# Patient Record
Sex: Female | Born: 1975 | Race: White | Hispanic: No | Marital: Single | Smoking: Never smoker
Health system: Southern US, Community
[De-identification: ages and names within clinical notes are randomized; demographics above are authoritative.]

## PROBLEM LIST (undated history)

## (undated) DIAGNOSIS — R7303 Prediabetes: Secondary | ICD-10-CM

## (undated) DIAGNOSIS — E079 Disorder of thyroid, unspecified: Secondary | ICD-10-CM

## (undated) DIAGNOSIS — E0500 Thyrotoxicosis with diffuse goiter without thyrotoxic crisis or storm: Secondary | ICD-10-CM

## (undated) DIAGNOSIS — E039 Hypothyroidism, unspecified: Secondary | ICD-10-CM

## (undated) DIAGNOSIS — Z9109 Other allergy status, other than to drugs and biological substances: Secondary | ICD-10-CM

## (undated) DIAGNOSIS — J45909 Unspecified asthma, uncomplicated: Secondary | ICD-10-CM

---
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
## 1997-06-20 HISTORY — PX: OTHER SURGICAL HISTORY: SHX169

## 1997-11-17 ENCOUNTER — Ambulatory Visit (HOSPITAL_COMMUNITY): Admission: RE | Admit: 1997-11-17 | Discharge: 1997-11-17 | Payer: Self-pay | Admitting: Unknown Physician Specialty

## 1997-11-19 ENCOUNTER — Emergency Department (HOSPITAL_COMMUNITY): Admission: EM | Admit: 1997-11-19 | Discharge: 1997-11-19 | Payer: Self-pay

## 1997-12-05 ENCOUNTER — Inpatient Hospital Stay (HOSPITAL_COMMUNITY): Admission: EM | Admit: 1997-12-05 | Discharge: 1997-12-16 | Payer: Self-pay | Admitting: Internal Medicine

## 1997-12-16 ENCOUNTER — Inpatient Hospital Stay (HOSPITAL_COMMUNITY)
Admission: RE | Admit: 1997-12-16 | Discharge: 1997-12-23 | Payer: Self-pay | Admitting: Physical Medicine & Rehabilitation

## 1998-04-27 ENCOUNTER — Emergency Department (HOSPITAL_COMMUNITY): Admission: EM | Admit: 1998-04-27 | Discharge: 1998-04-27 | Payer: Self-pay

## 1999-07-16 ENCOUNTER — Encounter: Payer: Self-pay | Admitting: Family Medicine

## 1999-07-16 ENCOUNTER — Ambulatory Visit (HOSPITAL_COMMUNITY): Admission: RE | Admit: 1999-07-16 | Discharge: 1999-07-16 | Payer: Self-pay | Admitting: Family Medicine

## 2003-12-10 ENCOUNTER — Emergency Department (HOSPITAL_COMMUNITY): Admission: EM | Admit: 2003-12-10 | Discharge: 2003-12-10 | Payer: Self-pay | Admitting: Family Medicine

## 2005-02-09 ENCOUNTER — Emergency Department (HOSPITAL_COMMUNITY): Admission: EM | Admit: 2005-02-09 | Discharge: 2005-02-09 | Payer: Self-pay | Admitting: Emergency Medicine

## 2005-11-27 ENCOUNTER — Emergency Department (HOSPITAL_COMMUNITY): Admission: EM | Admit: 2005-11-27 | Discharge: 2005-11-27 | Payer: Self-pay | Admitting: Emergency Medicine

## 2018-09-17 ENCOUNTER — Emergency Department (HOSPITAL_COMMUNITY): Payer: Self-pay

## 2018-09-17 ENCOUNTER — Inpatient Hospital Stay (HOSPITAL_COMMUNITY)
Admission: EM | Admit: 2018-09-17 | Discharge: 2018-09-20 | DRG: 494 | Disposition: A | Discharge status: Home Health | Payer: Self-pay | Attending: Orthopedic Surgery | Admitting: Orthopedic Surgery

## 2018-09-17 ENCOUNTER — Encounter (HOSPITAL_COMMUNITY): Payer: Self-pay | Admitting: *Deleted

## 2018-09-17 DIAGNOSIS — S82262A Displaced segmental fracture of shaft of left tibia, initial encounter for closed fracture: Principal | ICD-10-CM | POA: Diagnosis present

## 2018-09-17 DIAGNOSIS — S60511A Abrasion of right hand, initial encounter: Secondary | ICD-10-CM | POA: Diagnosis present

## 2018-09-17 DIAGNOSIS — Z885 Allergy status to narcotic agent status: Secondary | ICD-10-CM

## 2018-09-17 DIAGNOSIS — J45909 Unspecified asthma, uncomplicated: Secondary | ICD-10-CM | POA: Diagnosis present

## 2018-09-17 DIAGNOSIS — W1789XA Other fall from one level to another, initial encounter: Secondary | ICD-10-CM | POA: Diagnosis present

## 2018-09-17 DIAGNOSIS — Z419 Encounter for procedure for purposes other than remedying health state, unspecified: Secondary | ICD-10-CM

## 2018-09-17 DIAGNOSIS — Z6838 Body mass index (BMI) 38.0-38.9, adult: Secondary | ICD-10-CM

## 2018-09-17 DIAGNOSIS — E669 Obesity, unspecified: Secondary | ICD-10-CM | POA: Diagnosis present

## 2018-09-17 DIAGNOSIS — R7303 Prediabetes: Secondary | ICD-10-CM | POA: Diagnosis present

## 2018-09-17 DIAGNOSIS — Z79890 Hormone replacement therapy (postmenopausal): Secondary | ICD-10-CM

## 2018-09-17 DIAGNOSIS — Z7982 Long term (current) use of aspirin: Secondary | ICD-10-CM

## 2018-09-17 DIAGNOSIS — S82202A Unspecified fracture of shaft of left tibia, initial encounter for closed fracture: Secondary | ICD-10-CM | POA: Diagnosis present

## 2018-09-17 DIAGNOSIS — S82402A Unspecified fracture of shaft of left fibula, initial encounter for closed fracture: Secondary | ICD-10-CM | POA: Diagnosis present

## 2018-09-17 DIAGNOSIS — S82265A Nondisplaced segmental fracture of shaft of left tibia, initial encounter for closed fracture: Secondary | ICD-10-CM

## 2018-09-17 HISTORY — DX: Other allergy status, other than to drugs and biological substances: Z91.09

## 2018-09-17 HISTORY — DX: Unspecified asthma, uncomplicated: J45.909

## 2018-09-17 HISTORY — DX: Prediabetes: R73.03

## 2018-09-17 MED ORDER — ACETAMINOPHEN 325 MG PO TABS
650.0000 mg | ORAL_TABLET | Freq: Four times a day (QID) | ORAL | Status: DC | PRN
  Administered 2018-09-17 – 2018-09-18 (×2): 650 mg via ORAL
  Administered 2018-09-18 (×2): 325 mg via ORAL
  Administered 2018-09-19 – 2018-09-20 (×4): 650 mg via ORAL
  Filled 2018-09-17 (×8): qty 2

## 2018-09-17 MED ORDER — METHOCARBAMOL 500 MG PO TABS
500.0000 mg | ORAL_TABLET | Freq: Four times a day (QID) | ORAL | Status: DC | PRN
  Administered 2018-09-18: 250 mg via ORAL
  Filled 2018-09-17 (×2): qty 1

## 2018-09-17 MED ORDER — ONDANSETRON 4 MG PO TBDP: 4.0000 mg | ORAL_TABLET | Freq: Four times a day (QID) | ORAL | Status: DC | PRN

## 2018-09-17 MED ORDER — ONDANSETRON HCL 4 MG/2ML IJ SOLN: 4.0000 mg | Freq: Four times a day (QID) | INTRAMUSCULAR | Status: DC | PRN

## 2018-09-17 MED ORDER — LEVOTHYROXINE SODIUM 75 MCG PO TABS
150.0000 ug | ORAL_TABLET | Freq: Every day | ORAL | Status: DC
  Administered 2018-09-18 – 2018-09-20 (×3): 150 ug via ORAL
  Filled 2018-09-17: qty 2
  Filled 2018-09-17: qty 1
  Filled 2018-09-17: qty 2

## 2018-09-17 MED ORDER — CHLORHEXIDINE GLUCONATE 4 % EX LIQD: 60.0000 mL | Freq: Once | CUTANEOUS | Status: DC

## 2018-09-17 MED ORDER — DIPHENHYDRAMINE HCL 50 MG/ML IJ SOLN: 25.0000 mg | Freq: Four times a day (QID) | INTRAMUSCULAR | Status: DC | PRN

## 2018-09-17 MED ORDER — CEFAZOLIN SODIUM-DEXTROSE 2-4 GM/100ML-% IV SOLN
2.0000 g | INTRAVENOUS | Status: AC
  Administered 2018-09-18: 2 g via INTRAVENOUS
  Filled 2018-09-17: qty 100

## 2018-09-17 MED ORDER — SODIUM CHLORIDE 0.9 % IV SOLN
INTRAVENOUS | Status: DC
  Administered 2018-09-17: 23:00:00 via INTRAVENOUS

## 2018-09-17 MED ORDER — POVIDONE-IODINE 10 % EX SWAB: 2.0000 "application " | Freq: Once | CUTANEOUS | Status: DC

## 2018-09-17 MED ORDER — FENTANYL CITRATE (PF) 100 MCG/2ML IJ SOLN
12.5000 ug | INTRAMUSCULAR | Status: DC | PRN
  Filled 2018-09-17: qty 2

## 2018-09-17 MED ORDER — DIPHENHYDRAMINE HCL 25 MG PO CAPS: 25.0000 mg | ORAL_CAPSULE | Freq: Four times a day (QID) | ORAL | Status: DC | PRN

## 2018-09-17 NOTE — ED Notes (Signed)
ED TO INPATIENT HANDOFF REPORT  ED Nurse Name and Phone #: Florentina Addison, RN  S Name/Age/Gender Shelby Yang 43 y.o. female Room/Bed: WA16/WA16  Code Status   Code Status: Full Code  Home/SNF/Other Home Patient oriented to: AOx4 Is this baseline? Yes   Triage Complete: Triage complete  Chief Complaint Fall/left leg injury  Triage Note Pt BIBA from home c/o fall.  Pt reported walking into house from porch, wood beam on porch broke, pt fell through (right leg), left leg hit raised beam.    10/10 pain- pt refused pain meds from EMS. Pt repots allergies to hydrocodone.   Denies LOC, did not hit head, denies neck pain.     Allergies Allergies  Allergen Reactions  . Codeine Anaphylaxis and Hives  . Other Anaphylaxis and Shortness Of Breath    NOVACAINE    Level of Care/Admitting Diagnosis ED Disposition    ED Disposition Condition Comment   Admit  Hospital Area: [facility] [[id number]]  Level of Care: Med-Surg [16]  Diagnosis: Closed left tibial fracture [292446]  Admitting Physician: Yolonda Kida [[id number]]  Attending Physician: Yolonda Kida [[id number]]  Estimated length of stay: past midnight tomorrow  Certification:: I certify this patient will need inpatient services for at least 2 midnights  PT Class (Do Not Modify): Inpatient [101]  PT Acc Code (Do Not Modify): Private [1]       B Medical/Surgery History Past Medical History:  Diagnosis Date  . Asthma   . Environmental allergies   . Prediabetes    Past Surgical History:  Procedure Laterality Date  . radiation to thyroid  1999  . TONSILLECTOMY  1993     A IV Location/Drains/Wounds Patient Lines/Drains/Airways Status   Active Line/Drains/Airways    None          Intake/Output Last 24 hours No intake or output data in the 24 hours ending 09/17/18 2057  Labs/Imaging No results found for this or any previous visit (from the past 48 hour(s)). Dg Tibia/fibula  Left  Result Date: 09/17/2018 CLINICAL DATA:  Fall today with left leg pain. EXAM: LEFT TIBIA AND FIBULA - 2 VIEW COMPARISON:  None. FINDINGS: Exam demonstrates a mildly displaced slightly oblique fracture of the proximal tibial diaphysis with second oblique slightly comminuted fracture site over the mid to distal tibial diaphysis. Remainder the exam is unremarkable. IMPRESSION: Minimally displaced fractures over the proximal tibial diaphysis as well as second fracture site with mild comminution over the mid to distal tibial diaphysis. Electronically Signed   By: Elberta Fortis M.D.   On: 09/17/2018 18:01    Pending Labs Unresulted Labs (From admission, onward)    Start     Ordered   09/17/18 2009  Basic metabolic panel  Once,   R     03/83/33 2009   09/17/18 2009  CBC WITH DIFFERENTIAL  Once,   R     09/17/18 2009   09/17/18 2006  HIV antibody (Routine Testing)  Once,   R     09/17/18 2009          Vitals/Pain Today's Vitals   09/17/18 1900 09/17/18 1958 09/17/18 2007 09/17/18 2030  BP: 128/65  (!) 149/76 (!) 161/65  Pulse: 81  73 72  Resp: (!) 26  20 20   Temp:      TempSrc:      SpO2: 100%  100% 100%  PainSc:  5       Isolation Precautions No active isolations  Medications Medications  levothyroxine (SYNTHROID, LEVOTHROID) tablet 150 mcg (has no administration in time range)  0.9 %  sodium chloride infusion (has no administration in time range)  fentaNYL (SUBLIMAZE) injection 12.5 mcg (has no administration in time range)  methocarbamol (ROBAXIN) tablet 500 mg (has no administration in time range)  diphenhydrAMINE (BENADRYL) capsule 25 mg (has no administration in time range)    Or  diphenhydrAMINE (BENADRYL) injection 25 mg (has no administration in time range)  ondansetron (ZOFRAN-ODT) disintegrating tablet 4 mg (has no administration in time range)    Or  ondansetron (ZOFRAN) injection 4 mg (has no administration in time range)    Mobility walks Low fall risk    Focused Assessments    R Recommendations: See Admitting Provider Note  Report given to:   Additional Notes: NA

## 2018-09-17 NOTE — ED Provider Notes (Signed)
[facility]-EMERGENCY DEPT Provider Note   CSN: [id number] Arrival date & time: 09/17/18  1657    History   Chief Complaint Chief Complaint  Patient presents with  . Fall    HPI Shelby Yang is a 43 y.o. female.     HPI Patient presents after a fall.  States she was walking on a [place] porch and broke.  She fell through in the left leg felt backwards.  States it hit a 2 x 4 door.  Pain in the left lower leg.  States she thinks it is broken.  States she was unable to walk with.  Also hit right forearm but states it really does not hurt.  Not on anticoagulation.  Last ate some soup around 2 hours ago.  She is on thyroid medications.  No anticoagulation.  No neck pain.  No other injury.  Skin has had no bleeding.  States she cannot be pregnant.  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  Patient does not want pain medicine.  States she cannot take pills also. Past Medical History:  Diagnosis Date  . Asthma   . Environmental allergies   . Prediabetes     There are no active problems to display for this patient.   Past Surgical History:  Procedure Laterality Date  . radiation to thyroid  1999  . TONSILLECTOMY  1993     OB History   No obstetric history on file.      Home Medications    Prior to Admission medications   Medication Sig Start Date End Date Taking? Authorizing Provider  aspirin 325 MG tablet Take 162.5 mg by mouth daily as needed for moderate pain.   Yes [provider]  levothyroxine (SYNTHROID, LEVOTHROID) 150 MCG tablet Take 150 mcg by mouth daily. 09/13/18  Yes [provider]    Family History History reviewed. No pertinent family history.  Social History Social History   Tobacco Use  . Smoking status: Never Smoker  . Smokeless tobacco: Never Used  Substance Use Topics  . Alcohol use: Never    Frequency: Never  . Drug use: Never     Allergies   Codeine and Other   Review of Systems Review of Systems   Constitutional: Negative for appetite change.  HENT: Negative for congestion.   Respiratory: Negative for shortness of breath.   Cardiovascular: Negative for leg swelling.  Gastrointestinal: Negative for abdominal pain.  Genitourinary: Negative for flank pain.  Musculoskeletal: Negative for neck pain.       Left lower leg pain  Skin: Positive for wound.  Neurological: Negative for numbness.  Hematological: Negative for adenopathy.     Physical Exam Updated Vital Signs BP 128/65   Pulse 81   Temp (!) 97.4 F (36.3 C) (Axillary)   Resp (!) 26   SpO2 100%   Physical Exam Vitals signs and nursing note reviewed.  HENT:     Head: Atraumatic.     Nose: Nose normal.  Eyes:     Extraocular Movements: Extraocular movements intact.  Neck:     Musculoskeletal: Neck supple.  Cardiovascular:     Comments: Tachycardia Abdominal:     Tenderness: There is no abdominal tenderness.  Musculoskeletal:     Comments: Tenderness to left lower leg just proximal to the ankle.  There is an abrasion over the left anterior ankle but no underlying bony tenderness.  Knee with some tenderness but much less than the lower leg.  Neurovascular intact in left foot.  No other extremity tenderness or deformity.  No spinal tenderness.  No bleeding.  Skin:    General: Skin is warm.     Capillary Refill: Capillary refill takes less than 2 seconds.  Neurological:     General: No focal deficit present.     Mental Status: She is alert.      ED Treatments / Results  Labs (all labs ordered are listed, but only abnormal results are displayed) Labs Reviewed - No data to display  EKG None  Radiology Dg Tibia/fibula Left  Result Date: 09/17/2018 CLINICAL DATA:  Fall today with left leg pain. EXAM: LEFT TIBIA AND FIBULA - 2 VIEW COMPARISON:  None. FINDINGS: Exam demonstrates a mildly displaced slightly oblique fracture of the proximal tibial diaphysis with second oblique slightly comminuted fracture site  over the mid to distal tibial diaphysis. Remainder the exam is unremarkable. IMPRESSION: Minimally displaced fractures over the proximal tibial diaphysis as well as second fracture site with mild comminution over the mid to distal tibial diaphysis. Electronically Signed   By: Elberta Fortis M.D.   On: 09/17/2018 18:01    Procedures Procedures (including critical care time)  Medications Ordered in ED Medications - No data to display   Initial Impression / Assessment and Plan / ED Course  I have reviewed the triage vital signs and the nursing notes.  Pertinent labs & imaging results that were available during my care of the patient were reviewed by me and considered in my medical decision making (see chart for details).        Patient with fall.  Segmental fracture of left tibia.  Discussed with Dr. Devonne Doughty.  Patient really does not want to have surgery but they do not think would heal well without it.  Immobilized by Ortho.  Transfer to [facility] for surgery tomorrow.  Final Clinical Impressions(s) / ED Diagnoses   Final diagnoses:  Closed nondisplaced segmental fracture of shaft of left tibia, initial encounter    ED Discharge Orders    None       Benjiman Core, MD 09/17/18 [id number]

## 2018-09-17 NOTE — ED Notes (Signed)
Bed: GY69 Expected date:  Expected time:  Means of arrival:  Comments: EMS-leg pain

## 2018-09-17 NOTE — H&P (Signed)
Shelby Yang is an 43 y.o. female.    Chief Complaint: left leg pain  HPI: 43 y/o female c/o left leg pain after stepping through a rotten floor. Pt c/o immediate,severe pain to left lower leg after the incident. Denies any other injuries other than the left leg. Denies any LOC or syncope. Denies any numbness or tingling to left lower extremity. Pt is very anxious about surgery, admission to the hospital and taking pain medications. Denies any other symptoms  PCP:  Patient, No Pcp Per  PMH: Past Medical History:  Diagnosis Date  . Asthma   . Environmental allergies   . Prediabetes     PSes:  Allergies  Allergen Reactions  . Codeine Anaphylaxis and Hives  . Other Anaphylaxis and Shortness Of Breath    NOVACAINE    Medications: Current Facility-Administered Medications  Medication Dose Route Frequency Provider Last Rate Last Dose  . 0.9 %  sodium chloride infusion   Intravenous Continuous Colvin Caroli, PA-C      . diphenhydrAMINE (BENADRYL) capsule 25 mg  25 mg Oral Q6H PRN Colvin Caroli, PA-C       Or  . diphenhydrAMINE (BENADRYL) injection 25 mg  25 mg Intravenous Q6H PRN Colvin Caroli, PA-C      . fentaNYL (SUBLIMAZE) injection 12.5 mcg  12.5 mcg Intravenous Q2H PRN Colvin Caroli, PA-C      . levothyroxine (SYNTHROID, LEVOTHROID) tablet 150 mcg  150 mcg Oral Daily Graiden Henes, Katharina Caper, PA-C      . methocarbamol (ROBAXIN) tablet 500 mg  500 mg Oral Q6H PRN Colvin Caroli, PA-C      . ondansetron (ZOFRAN-ODT) disintegrating tablet 4 mg  4 mg Oral Q6H PRN Colvin Caroli, PA-C       Or  . ondansetron [facility]) injection 4 mg  4 mg Intravenous Q6H PRN Colvin Caroli, PA-C       Current Outpatient Medications  Medication Sig Dispense Refill  . aspirin 325 MG tablet Take 162.5 mg by mouth daily as needed for moderate pain.    Marland Kitchen levothyroxine (SYNTHROID, LEVOTHROID) 150 MCG tablet Take 150 mcg by mouth daily.       No results found for this or any previous visit (from the past 48 hour(s)). Dg Tibia/fibula Left  Result Date: 09/17/2018 CLINICAL DATA:  Fall today with left leg pain. EXAM: LEFT TIBIA AND FIBULA - 2 VIEW COMPARISON:  None. FINDINGS: Exam demonstrates a mildly displaced slightly oblique fracture of the proximal tibial diaphysis with second oblique slightly comminuted fracture site over the mid to distal tibial diaphysis. Remainder the exam is unremarkable. IMPRESSION: Minimally displaced fractures over the proximal tibial diaphysis as well as second fracture site with mild comminution over the mid to distal tibial diaphysis. Electronically Signed   By: Elberta Fortis M.D.   On: 09/17/2018 18:01    ROS Left leg pain Unable to walk  Physical Exam: Alert and appropriate 43 y/o female in no acute distress Cervical spine with full rom and no tenderness Bilateral upper extremities: full rom with no deformity Small abrasion to right hand otherwise negative exam Hips non tender and pelvis is stable Right lower extremity with full rom and no tenderness nv intact distally Left lower leg: minimal edema and early ecchymosis to distal 1/3 tiba, not taken thru full rom due to known fracture  Assessment/Plan Assessment: left tibia fracture  Plan: Discussed case with Dr. Ranell Patrick and Dr. Aundria Rud and plan for surgical management of  the left tibia fracture to be completed at [facility] hospital tomorrow Discussed risks and benefits of surgery with both the patient and her mother over the telephone Discussed the possibility of fat embolism, blood clot, and compartment syndrome for reasons to stay and have the surgery Pt in agreement  Brad Antonieta Iba, MPAS [facility] is now [facility]  [place] [address], [address], [place], [place] [address] Phone: [phone] [email or web address] Facebook  [employer]

## 2018-09-17 NOTE — ED Triage Notes (Signed)
Pt BIBA from home c/o fall.  Pt reported walking into house from porch, wood beam on porch broke, pt fell through (right leg), left leg hit raised beam.    10/10 pain- pt refused pain meds from EMS. Pt repots allergies to hydrocodone.   Denies LOC, did not hit head, denies neck pain.

## 2018-09-17 NOTE — ED Notes (Signed)
Called [facility] for transfer  

## 2018-09-18 ENCOUNTER — Inpatient Hospital Stay (HOSPITAL_COMMUNITY): Payer: Self-pay

## 2018-09-18 ENCOUNTER — Encounter (HOSPITAL_COMMUNITY): Payer: Self-pay | Admitting: Registered Nurse

## 2018-09-18 ENCOUNTER — Inpatient Hospital Stay (HOSPITAL_COMMUNITY): Payer: Self-pay | Admitting: Registered Nurse

## 2018-09-18 ENCOUNTER — Encounter (HOSPITAL_COMMUNITY)
Admission: EM | Disposition: A | Discharge status: Home Health | Payer: Self-pay | Source: Home / Self Care | Attending: Orthopedic Surgery

## 2018-09-18 ENCOUNTER — Other Ambulatory Visit: Payer: Self-pay

## 2018-09-18 HISTORY — PX: TIBIA IM NAIL INSERTION: SHX2516

## 2018-09-18 LAB — HCG, QUANTITATIVE, PREGNANCY

## 2018-09-18 LAB — SURGICAL PCR SCREEN
MRSA, PCR: NEGATIVE
Staphylococcus aureus: NEGATIVE

## 2018-09-18 SURGERY — INSERTION, INTRAMEDULLARY ROD, TIBIA
Anesthesia: Spinal | Laterality: Left

## 2018-09-18 MED ORDER — PROMETHAZINE HCL 25 MG/ML IJ SOLN: 6.2500 mg | INTRAMUSCULAR | Status: DC | PRN

## 2018-09-18 MED ORDER — BUPIVACAINE HCL (PF) 0.75 % IJ SOLN
INTRAMUSCULAR | Status: DC | PRN
  Administered 2018-09-18: 2 mL via INTRATHECAL

## 2018-09-18 MED ORDER — 0.9 % SODIUM CHLORIDE (POUR BTL) OPTIME
TOPICAL | Status: DC | PRN
  Administered 2018-09-18: 1000 mL

## 2018-09-18 MED ORDER — HYDROMORPHONE HCL 1 MG/ML IJ SOLN: 0.2500 mg | INTRAMUSCULAR | Status: DC | PRN

## 2018-09-18 MED ORDER — MIDAZOLAM HCL 2 MG/2ML IJ SOLN
INTRAMUSCULAR | Status: AC
  Filled 2018-09-18: qty 2

## 2018-09-18 MED ORDER — PROPOFOL 10 MG/ML IV BOLUS
INTRAVENOUS | Status: AC
  Filled 2018-09-18: qty 20

## 2018-09-18 MED ORDER — ONDANSETRON HCL 4 MG/2ML IJ SOLN
INTRAMUSCULAR | Status: DC | PRN
  Administered 2018-09-18: 4 mg via INTRAVENOUS

## 2018-09-18 MED ORDER — ONDANSETRON HCL 4 MG/2ML IJ SOLN: 4.0000 mg | Freq: Four times a day (QID) | INTRAMUSCULAR | Status: DC | PRN

## 2018-09-18 MED ORDER — CEFAZOLIN SODIUM-DEXTROSE 2-4 GM/100ML-% IV SOLN
2.0000 g | Freq: Four times a day (QID) | INTRAVENOUS | Status: AC
  Administered 2018-09-18 – 2018-09-19 (×3): 2 g via INTRAVENOUS
  Filled 2018-09-18 (×3): qty 100

## 2018-09-18 MED ORDER — DEXAMETHASONE SODIUM PHOSPHATE 10 MG/ML IJ SOLN
INTRAMUSCULAR | Status: DC | PRN
  Administered 2018-09-18: 10 mg via INTRAVENOUS

## 2018-09-18 MED ORDER — FENTANYL CITRATE (PF) 250 MCG/5ML IJ SOLN
INTRAMUSCULAR | Status: AC
  Filled 2018-09-18: qty 5

## 2018-09-18 MED ORDER — OXYCODONE HCL 5 MG/5ML PO SOLN: 5.0000 mg | Freq: Once | ORAL | Status: DC | PRN

## 2018-09-18 MED ORDER — EPHEDRINE SULFATE-NACL 50-0.9 MG/10ML-% IV SOSY
PREFILLED_SYRINGE | INTRAVENOUS | Status: DC | PRN
  Administered 2018-09-18: 5 mg via INTRAVENOUS
  Administered 2018-09-18: 10 mg via INTRAVENOUS

## 2018-09-18 MED ORDER — ONDANSETRON HCL 4 MG PO TABS: 4.0000 mg | ORAL_TABLET | Freq: Four times a day (QID) | ORAL | Status: DC | PRN

## 2018-09-18 MED ORDER — SODIUM CHLORIDE 0.9 % IV SOLN
INTRAVENOUS | Status: DC | PRN
  Administered 2018-09-18: 30 ug/min via INTRAVENOUS

## 2018-09-18 MED ORDER — PROPOFOL 10 MG/ML IV BOLUS
INTRAVENOUS | Status: DC | PRN
  Administered 2018-09-18 (×2): 50 mg via INTRAVENOUS

## 2018-09-18 MED ORDER — TRAMADOL HCL 50 MG PO TABS
50.0000 mg | ORAL_TABLET | Freq: Four times a day (QID) | ORAL | Status: DC | PRN
  Administered 2018-09-18: 25 mg via ORAL
  Administered 2018-09-19 – 2018-09-20 (×3): 50 mg via ORAL
  Filled 2018-09-18 (×4): qty 1

## 2018-09-18 MED ORDER — MIDAZOLAM HCL 5 MG/5ML IJ SOLN
INTRAMUSCULAR | Status: DC | PRN
  Administered 2018-09-18: 2 mg via INTRAVENOUS

## 2018-09-18 MED ORDER — METOCLOPRAMIDE HCL 5 MG/ML IJ SOLN: 5.0000 mg | Freq: Three times a day (TID) | INTRAMUSCULAR | Status: DC | PRN

## 2018-09-18 MED ORDER — PHENYLEPHRINE 40 MCG/ML (10ML) SYRINGE FOR IV PUSH (FOR BLOOD PRESSURE SUPPORT)
PREFILLED_SYRINGE | INTRAVENOUS | Status: DC | PRN
  Administered 2018-09-18 (×2): 80 ug via INTRAVENOUS

## 2018-09-18 MED ORDER — DOCUSATE SODIUM 100 MG PO CAPS
100.0000 mg | ORAL_CAPSULE | Freq: Two times a day (BID) | ORAL | Status: DC
  Filled 2018-09-18 (×2): qty 1

## 2018-09-18 MED ORDER — LACTATED RINGERS IV SOLN
INTRAVENOUS | Status: DC | PRN
  Administered 2018-09-18 (×2): via INTRAVENOUS

## 2018-09-18 MED ORDER — PROPOFOL 500 MG/50ML IV EMUL
INTRAVENOUS | Status: DC | PRN
  Administered 2018-09-18: 75 ug/kg/min via INTRAVENOUS

## 2018-09-18 MED ORDER — PROPOFOL 1000 MG/100ML IV EMUL
INTRAVENOUS | Status: AC
  Filled 2018-09-18: qty 200

## 2018-09-18 MED ORDER — METOCLOPRAMIDE HCL 5 MG PO TABS: 5.0000 mg | ORAL_TABLET | Freq: Three times a day (TID) | ORAL | Status: DC | PRN

## 2018-09-18 MED ORDER — OXYCODONE HCL 5 MG PO TABS: 5.0000 mg | ORAL_TABLET | Freq: Once | ORAL | Status: DC | PRN

## 2018-09-18 SURGICAL SUPPLY — 73 items
ALCOHOL 70% 16 OZ (MISCELLANEOUS) ×3 IMPLANT
BANDAGE ACE 4X5 VEL STRL LF (GAUZE/BANDAGES/DRESSINGS) ×3 IMPLANT
BANDAGE ACE 6X5 VEL STRL LF (GAUZE/BANDAGES/DRESSINGS) ×3 IMPLANT
BANDAGE ESMARK 6X9 LF (GAUZE/BANDAGES/DRESSINGS) ×1 IMPLANT
BIT DRILL CAL 3.2 LONG (BIT) ×1 IMPLANT
BIT DRILL CAL 3.2MM LONG (BIT) ×1
BIT DRILL SHORT 3.2MM (DRILL) IMPLANT
BNDG CMPR 9X6 STRL LF SNTH (GAUZE/BANDAGES/DRESSINGS) ×1
BNDG ESMARK 6X9 LF (GAUZE/BANDAGES/DRESSINGS) ×3
COVER MAYO STAND STRL (DRAPES) ×3 IMPLANT
COVER SURGICAL LIGHT HANDLE (MISCELLANEOUS) ×3 IMPLANT
COVER WAND RF STERILE (DRAPES) ×3 IMPLANT
CUFF TOURNIQUET SINGLE 34IN LL (TOURNIQUET CUFF) ×1 IMPLANT
DRAPE C-ARM 35X43 STRL (DRAPES) ×2 IMPLANT
DRAPE C-ARM 42X72 X-RAY (DRAPES) ×2 IMPLANT
DRAPE C-ARMOR (DRAPES) ×5 IMPLANT
DRAPE HALF SHEET 40X57 (DRAPES) ×3 IMPLANT
DRAPE IMP U-DRAPE 54X76 (DRAPES) ×3 IMPLANT
DRAPE ORTHO SPLIT 77X108 STRL (DRAPES) ×3
DRAPE POUCH INSTRU U-SHP 10X18 (DRAPES) ×3 IMPLANT
DRAPE SURG ORHT 6 SPLT 77X108 (DRAPES) IMPLANT
DRAPE U-SHAPE 47X51 STRL (DRAPES) ×3 IMPLANT
DRAPE UTILITY XL STRL (DRAPES) ×6 IMPLANT
DRILL SHORT 3.2MM (DRILL) ×6
DRSG XEROFORM 1X8 (GAUZE/BANDAGES/DRESSINGS) ×2 IMPLANT
DURAPREP 26ML APPLICATOR (WOUND CARE) ×5 IMPLANT
ELECT CAUTERY BLADE 6.4 (BLADE) ×3 IMPLANT
ELECT REM PT RETURN 9FT ADLT (ELECTROSURGICAL) ×3
ELECTRODE REM PT RTRN 9FT ADLT (ELECTROSURGICAL) ×1 IMPLANT
FACESHIELD WRAPAROUND (MASK) IMPLANT
FACESHIELD WRAPAROUND OR TEAM (MASK) ×2 IMPLANT
GAUZE SPONGE 4X4 12PLY STRL (GAUZE/BANDAGES/DRESSINGS) ×3 IMPLANT
GAUZE XEROFORM 1X8 LF (GAUZE/BANDAGES/DRESSINGS) ×6 IMPLANT
GAUZE XEROFORM 5X9 LF (GAUZE/BANDAGES/DRESSINGS) ×2 IMPLANT
GLOVE BIO SURGEON STRL SZ7.5 (GLOVE) ×3 IMPLANT
GLOVE BIO SURGEON STRL SZ8 (GLOVE) ×4 IMPLANT
GLOVE BIOGEL PI IND STRL 6.5 (GLOVE) IMPLANT
GLOVE BIOGEL PI IND STRL 8 (GLOVE) ×1 IMPLANT
GLOVE BIOGEL PI IND STRL 8.5 (GLOVE) IMPLANT
GLOVE BIOGEL PI INDICATOR 6.5 (GLOVE) ×2
GLOVE BIOGEL PI INDICATOR 8 (GLOVE) ×2
GLOVE BIOGEL PI INDICATOR 8.5 (GLOVE) ×2
GLOVE ECLIPSE 6.0 STRL STRAW (GLOVE) ×2 IMPLANT
GOWN STRL REUS W/ TWL LRG LVL3 (GOWN DISPOSABLE) ×2 IMPLANT
GOWN STRL REUS W/ TWL XL LVL3 (GOWN DISPOSABLE) ×1 IMPLANT
GOWN STRL REUS W/TWL LRG LVL3 (GOWN DISPOSABLE) ×6
GOWN STRL REUS W/TWL XL LVL3 (GOWN DISPOSABLE) ×3
GUIDEWIRE 3.2X400 (WIRE) ×2 IMPLANT
KIT BASIN OR (CUSTOM PROCEDURE TRAY) ×3 IMPLANT
MANIFOLD NEPTUNE II (INSTRUMENTS) ×3 IMPLANT
NAIL TIB 8X330 (Nail) ×2 IMPLANT
NS IRRIG 1000ML POUR BTL (IV SOLUTION) ×3 IMPLANT
PACK TOTAL KNEE CUSTOM (KITS) ×3 IMPLANT
PAD ABD 8X10 STRL (GAUZE/BANDAGES/DRESSINGS) ×6 IMPLANT
PAD CAST 4YDX4 CTTN HI CHSV (CAST SUPPLIES) ×2 IMPLANT
PADDING CAST COTTON 4X4 STRL (CAST SUPPLIES) ×3
PADDING CAST COTTON 6X4 STRL (CAST SUPPLIES) ×2 IMPLANT
REAMER ROD DEEP FLUTE 2.5X950 (INSTRUMENTS) ×2 IMPLANT
SCREW LOCK 4X26 TI (Screw) ×2 IMPLANT
SCREW LOCK 4X30 TI (Screw) ×2 IMPLANT
SCREW LOCK 4X66 TI (Screw) ×2 IMPLANT
SCREW LOCKING 4.0 34MM (Screw) ×4 IMPLANT
SCREW LOCKING 4X56 (Screw) ×2 IMPLANT
STAPLER SKIN PROX 35W (STAPLE) ×2 IMPLANT
STAPLER SKIN PROX WIDE 3.9 (STAPLE) ×3 IMPLANT
SUT MON AB 2-0 CT1 36 (SUTURE) ×3 IMPLANT
SUT VIC AB 0 CT1 27 (SUTURE) ×3
SUT VIC AB 0 CT1 27XBRD ANBCTR (SUTURE) ×1 IMPLANT
SUT VIC AB 2-0 CT1 27 (SUTURE) ×3
SUT VIC AB 2-0 CT1 TAPERPNT 27 (SUTURE) IMPLANT
TOWEL OR 17X26 10 PK STRL BLUE (TOWEL DISPOSABLE) ×6 IMPLANT
WATER STERILE IRR 1000ML POUR (IV SOLUTION) ×3 IMPLANT
YANKAUER SUCT BULB TIP NO VENT (SUCTIONS) ×2 IMPLANT

## 2018-09-18 NOTE — Brief Op Note (Signed)
09/18/2018  12:29 PM  PATIENT:  Shelby Yang  43 y.o. female  PRE-OPERATIVE DIAGNOSIS:  Left Tibia Fracture  POST-OPERATIVE DIAGNOSIS:  Left Tibia Fracture  PROCEDURE:  Procedure(s): INTRAMEDULLARY (IM) NAIL TIBIAL (Left)  SURGEON:  Surgeon(s) and Role:    * Yolonda Kida, MD - Primary  PHYSICIAN ASSISTANT:   ASSISTANTS: none   ANESTHESIA:   spinal  EBL:  50 mL   BLOOD ADMINISTERED:none  DRAINS: none   LOCAL MEDICATIONS USED:  NONE  SPECIMEN:  No Specimen  DISPOSITION OF SPECIMEN:  N/A  COUNTS:  YES  TOURNIQUET:  * No tourniquets in log *  DICTATION: .Note written in EPIC  PLAN OF CARE: Admit to inpatient   PATIENT DISPOSITION:  PACU - hemodynamically stable.   Delay start of Pharmacological VTE agent (>24hrs) due to surgical blood loss or risk of bleeding: not applicable

## 2018-09-18 NOTE — H&P (Signed)
H&P update  The surgical history has been reviewed and remains accurate without interval change.  The patient was re-examined and patient's physiologic condition has not changed significantly in the last 30 days. The condition still exists that makes this procedure necessary. The treatment plan remains the same, without new options for care.  No new pharmacological allergies or types of therapy has been initiated that would change the plan or the appropriateness of the plan.  The patient and/or family understand the potential benefits and risks.  Berl Bonfanti P. Aundria Rud, MD 09/18/2018 10:21 AM

## 2018-09-18 NOTE — Anesthesia Preprocedure Evaluation (Signed)
Anesthesia Evaluation  Patient identified by MRN, date of birth, ID band Patient awake    Reviewed: Allergy & Precautions, NPO status , Patient's Chart, lab work & pertinent test results  Airway Mallampati: II  TM Distance: >3 FB Neck ROM: Full    Dental no notable dental hx.    Pulmonary asthma ,    Pulmonary exam normal breath sounds clear to auscultation       Cardiovascular negative cardio ROS Normal cardiovascular exam Rhythm:Regular Rate:Normal     Neuro/Psych negative neurological ROS  negative psych ROS   GI/Hepatic negative GI ROS, Neg liver ROS,   Endo/Other  negative endocrine ROS  Renal/GU negative Renal ROS  negative genitourinary   Musculoskeletal negative musculoskeletal ROS (+)   Abdominal (+) + obese,   Peds negative pediatric ROS (+)  Hematology negative hematology ROS (+)   Anesthesia Other Findings   Reproductive/Obstetrics negative OB ROS                             Anesthesia Physical Anesthesia Plan  ASA: II  Anesthesia Plan: Spinal   Post-op Pain Management:    Induction: Intravenous  PONV Risk Score and Plan: 2 and Ondansetron and Midazolam  Airway Management Planned: Simple Face Mask  Additional Equipment:   Intra-op Plan:   Post-operative Plan:   Informed Consent: I have reviewed the patients History and Physical, chart, labs and discussed the procedure including the risks, benefits and alternatives for the proposed anesthesia with the patient or authorized representative who has indicated his/her understanding and acceptance.     Dental advisory given  Plan Discussed with: CRNA  Anesthesia Plan Comments:         Anesthesia Quick Evaluation

## 2018-09-18 NOTE — Progress Notes (Signed)
Orthopedic Tech Progress Note Patient Details:  Shelby Yang 1976-01-27 [id number]  Ortho Devices Type of Ortho Device: CAM walker Splint Material: Plaster Ortho Device/Splint Location: Left Foot Ortho Device/Splint Interventions: Ordered, Application, Adjustment   Post Interventions Patient Tolerated: Well Instructions Provided: Adjustment of device, Care of device   Eldin Bonsell J Kasee Hantz 09/18/2018, 3:34 PM

## 2018-09-18 NOTE — Care Management (Signed)
Financial counselor will see patient concerning medicaid.

## 2018-09-18 NOTE — Transfer of Care (Signed)
Immediate Anesthesia Transfer of Care Note  Patient: Shelby Yang  Procedure(s) Performed: INTRAMEDULLARY (IM) NAIL TIBIAL (Left )  Patient Location: PACU  Anesthesia Type:MAC and Spinal  Level of Consciousness: drowsy, patient cooperative and responds to stimulation  Airway & Oxygen Therapy: Patient Spontanous Breathing and Patient connected to face mask oxygen  Post-op Assessment: Report given to RN and Post -op Vital signs reviewed and stable  Post vital signs: Reviewed and stable  Last Vitals:  Vitals Value Taken Time  BP 118/75 09/18/2018 12:32 PM  Temp 36.4 C 09/18/2018 12:32 PM  Pulse 55 09/18/2018 12:35 PM  Resp 12 09/18/2018 12:35 PM  SpO2 100 % 09/18/2018 12:35 PM  Vitals shown include unvalidated device data.  Last Pain:  Vitals:   09/18/18 0819  TempSrc:   PainSc: 3       Patients Stated Pain Goal: 3 (50/51/83 3582)  Complications: No apparent anesthesia complications

## 2018-09-18 NOTE — Op Note (Signed)
Date of Surgery: 09/18/2018  INDICATIONS: Ms. Bina is a 43 y.o.-year-old female who was involved in a fall from porch and sustained a left tibia fracture. The risks and benefits of the procedure discussed with the patient prior to the procedure and all questions were answered; consent was obtained.  PREOPERATIVE DIAGNOSIS:  left segmental tibia fracture, closed  POSTOPERATIVE DIAGNOSIS: Same  PROCEDURE:   1. Left segmental  tibia closed reduction and intramedullary nailing CPT: 50388    SURGEON: Maryan Rued, M.D.  ASSISTANT: none .  ANESTHESIA:  , spinal  IV FLUIDS AND URINE: See anesthesia record.  ESTIMATED BLOOD LOSS: 50 mL.  IMPLANTS:  Synthes  8 mm x 330 mm 2 proximal and 2 distal interlock   DRAINS: None.  COMPLICATIONS: None.  Tourniquet: None  DESCRIPTION OF PROCEDURE: The patient was brought to the operating room and placed supine on the operating table.  The patient's leg had been signed prior to the Procedure, by myself in the preoperative holding area.  The patient had the anesthesia placed by the anesthesiologist.  The prep verification and incision time-outs were performed to confirm that this was the correct patient, site, side and location. The patient had an SCD on the opposite lower extremity. The patient did receive antibiotics prior to the incision and was re-dosed during the procedure as needed at indicated intervals.   The patient had the lower extremity prepped and draped in the standard surgical fashion.  The incision was first made over the quadriceps tendon in the midline and taken down to the skin and subcutaneous tissue to expose the peritenon. The peritenon was incised in line with the skin incision and then a poke hole was made in the quadriceps tendon in the midline.  A knife was then used to longitudinally divide the tendon in line with its fibers, taking care not to cross over any fibers. Then the guide wire was placed, utilizing  the suprapatellar approach and sleeve instrumentation, at the proximal, anterior tibia, confirming its location on both AP and lateral views. The wire was drilled into the bone and then the opening reamer was placed over this and maneuvered so that the reamer was parallel with anterior cortex of the tibia. The ball-tipped guide wire was then placed down into the canal towards the fracture site. The fracture was reduced, care was taken to confirm reduction on both AP and lateral view, and the wire was passed and confirmed to be in the proper location on both AP and lateral views.  The measuring stick was used to measure the length of the nail.  Sequential reaming was then performed, initial chatter was heard with the 8 mm opening reamer, and we sequentially reamed up to a 9.5 mm reamer to accept a 8 mm nail.  Then the nail was gently hammered into place over the guide wire and the guide wire was removed. The proximal screws were placed through the interlocking drill guide using the sleeve. The distal screws were placed using the perfect circles technique. All screws were placed in the standard fashion, first incising the skin and then spreading with a tonsil, then drilling, measuring with a depth gauge, and then placing the screws by hand.  The final x-rays were taken in both AP and lateral views to confirm the fracture reduction as well as the placement of all hardware.   The fibula fracture was treated in a closed manner.    The wounds were copiously irrigated with saline and then  the peritenon of the quadriceps tendon was closed with 0 Vicryl figure-of-eight interrupted sutures. 2.0 Monocryl was used to close the subcutaneous layer.  Staples were then used to close all of the open incision wounds.  The wounds were cleaned and dried a final time and a sterile dressing was placed.   The patient was then placed in a short leg splint in neutral ankle dorsiflexion. The patient's calf was soft to palpation at the  end of the case.  There was no compartment syndrome.  The patient was then transferred to a bed and taken to the recovery room in stable condition.  All counts were correct at the end of the case.   POSTOPERATIVE PLAN: Ms. Matt will be 50 % WB with walker and will return 2 for suture removal.  Ms. Swearingen will receive DVT prophylaxis in the form of bid asa x 6 weeks.   Duwayne Heck, MD [phone] [facility], [place]

## 2018-09-18 NOTE — Anesthesia Postprocedure Evaluation (Signed)
Anesthesia Post Note  Patient: Shelby Yang  Procedure(s) Performed: INTRAMEDULLARY (IM) NAIL TIBIAL (Left )     Patient location during evaluation: PACU Anesthesia Type: Spinal Level of consciousness: oriented and awake and alert Pain management: pain level controlled Vital Signs Assessment: post-procedure vital signs reviewed and stable Respiratory status: spontaneous breathing, respiratory function stable and patient connected to nasal cannula oxygen Cardiovascular status: blood pressure returned to baseline and stable Postop Assessment: no headache, no backache and no apparent nausea or vomiting Anesthetic complications: no    Last Vitals:  Vitals:   09/18/18 1310 09/18/18 1941  BP: 125/74 140/79  Pulse: 66 69  Resp:  14  Temp:  36.9 C  SpO2: 98% 98%    Last Pain:  Vitals:   09/18/18 2000  TempSrc:   PainSc: 0-No pain                 Emeka Lindner COKER

## 2018-09-18 NOTE — Anesthesia Procedure Notes (Signed)
Spinal  Patient location during procedure: OR Start time: 09/18/2018 10:59 AM End time: 09/18/2018 11:04 AM Staffing Anesthesiologist: Lowella Curb, MD Performed: anesthesiologist  Preanesthetic Checklist Completed: patient identified, site marked, surgical consent, pre-op evaluation, timeout performed, IV checked, risks and benefits discussed and monitors and equipment checked Spinal Block Patient position: sitting Prep: Betadine Patient monitoring: heart rate, cardiac monitor, continuous pulse ox and blood pressure Approach: midline Location: L3-4 Injection technique: single-shot Needle Needle type: Quincke  Needle gauge: 22 G Needle length: 15 cm Assessment Sensory level: T4

## 2018-09-19 LAB — CBC
HCT: 35.6 % — ABNORMAL LOW (ref 36.0–46.0)
Hemoglobin: 12.6 g/dL (ref 12.0–15.0)
MCH: 33 pg (ref 26.0–34.0)
MCHC: 35.4 g/dL (ref 30.0–36.0)
MCV: 93.2 fL (ref 80.0–100.0)
PLATELETS: 284 10*3/uL (ref 150–400)
RBC: 3.82 MIL/uL — ABNORMAL LOW (ref 3.87–5.11)
RDW: 12.8 % (ref 11.5–15.5)
WBC: 10 10*3/uL (ref 4.0–10.5)
nRBC: 0 % (ref 0.0–0.2)

## 2018-09-19 NOTE — Plan of Care (Signed)

## 2018-09-19 NOTE — Plan of Care (Signed)
  Problem: Activity: Goal: Risk for activity intolerance will decrease 09/19/2018 1421 by Darrow Bussing, RN Outcome: Progressing 09/19/2018 1135 by Darrow Bussing, RN Outcome: Progressing   Problem: Nutrition: Goal: Adequate nutrition will be maintained Outcome: Progressing   Problem: Coping: Goal: Level of anxiety will decrease 09/19/2018 1421 by Darrow Bussing, RN Outcome: Progressing 09/19/2018 1135 by Darrow Bussing, RN Outcome: Progressing   Problem: Elimination: Goal: Will not experience complications related to bowel motility Outcome: Progressing   Problem: Pain Managment: Goal: General experience of comfort will improve 09/19/2018 1421 by Darrow Bussing, RN Outcome: Progressing 09/19/2018 1135 by Darrow Bussing, RN Outcome: Progressing   Problem: Safety: Goal: Ability to remain free from injury will improve 09/19/2018 1421 by Darrow Bussing, RN Outcome: Progressing 09/19/2018 1135 by Darrow Bussing, RN Outcome: Progressing   Problem: Skin Integrity: Goal: Risk for impaired skin integrity will decrease 09/19/2018 1421 by Darrow Bussing, RN Outcome: Progressing 09/19/2018 1135 by Darrow Bussing, RN Outcome: Progressing

## 2018-09-19 NOTE — Evaluation (Signed)
Physical Therapy Evaluation Patient Details Name: Shelby Yang MRN: [id number] DOB: Oct 11, 1975 Today's Date: 09/19/2018   History of Present Illness  Patient POD1 s/p Tibial IM nail from fall at home 3/31.  Patient is 50% WB LLE  Clinical Impression  Pt admitted with above diagnosis. Pt currently with functional limitations due to the deficits listed below (see PT Problem List). PTA, pt independent lives with family and adult children with 6 stairs to enter home. Today limited greatly by pain, mod A for bed mobility and only tolerating a transfer to [facility] Discussed pain with RN. Pt has 6 stairs to enter and will need to ambulate in subsequent PT sessions.  Pt will benefit from skilled PT to increase their independence and safety with mobility to allow discharge to the venue listed below.       Follow Up Recommendations Home health PT;Supervision/Assistance - 24 hour(with significant progression )    Equipment Recommendations       Recommendations for Other Services OT consult     Precautions / Restrictions Restrictions Weight Bearing Restrictions: Yes LLE Weight Bearing: Partial weight bearing LLE Partial Weight Bearing Percentage or Pounds: 50      Mobility  Bed Mobility Overal bed mobility: Needs Assistance Bed Mobility: Supine to Sit;Sit to Supine     Supine to sit: Mod assist Sit to supine: Mod assist   General bed mobility comments: Mod A to to assit limb over EOB due t pain  Transfers Overall transfer level: Needs assistance Equipment used: Rolling walker (2 wheeled) Transfers: Sit to/from [employer] Sit to Stand: Mod assist Stand pivot transfers: Mod assist       General transfer comment: mod A to power up very limited by pain today  Ambulation/Gait             General Gait Details: unable due to pain today  Stairs            Wheelchair Mobility    Modified Rankin (Stroke Patients Only)       Balance Overall  balance assessment: Needs assistance   Sitting balance-Leahy Scale: Fair       Standing balance-Leahy Scale: Poor                               Pertinent Vitals/Pain Pain Assessment: Faces Faces Pain Scale: Hurts whole lot Pain Location: L Leg Pain Descriptors / Indicators: Aching;Discomfort;Moaning Pain Intervention(s): Limited activity within patient's tolerance;Monitored during session;Premedicated before session    Home Living Family/patient expects to be discharged to:: Private residence Living Arrangements: Parent Available Help at Discharge: Family Type of Home: House Home Access: Stairs to enter Entrance Stairs-Rails: Can reach both Entrance Stairs-Number of Steps: 6 Home Layout: One level Home Equipment: None Additional Comments: pt with BSC, RW, W/C delivered to room    Prior Function Level of Independence: Independent         Comments: works as Mining engineer        Extremity/Trunk Assessment   Upper Extremity Assessment Upper Extremity Assessment: Overall WFL for tasks assessed    Lower Extremity Assessment Lower Extremity Assessment: Overall WFL for tasks assessed(L post op pain and weakness)       Communication   Communication: No difficulties  Cognition Arousal/Alertness: Awake/alert Behavior During Therapy: WFL for tasks assessed/performed Overall Cognitive Status: Within Functional Limits for tasks assessed  General Comments      Exercises     Assessment/Plan    PT Assessment Patient needs continued PT services  PT Problem List Decreased strength       PT Treatment Interventions DME instruction;Gait training;Stair training;Functional mobility training;Therapeutic activities;Therapeutic exercise    PT Goals (Current goals can be found in the Care Plan section)  Acute Rehab PT Goals Patient Stated Goal: go home PT Goal Formulation: With  patient Time For Goal Achievement: 10/03/18 Potential to Achieve Goals: Good    Frequency Min 3X/week   Barriers to discharge Inaccessible home environment      Co-evaluation               AM-PAC PT "6 Clicks" Mobility  Outcome Measure Help needed turning from your back to your side while in a flat bed without using bedrails?: A Lot Help needed moving from lying on your back to sitting on the side of a flat bed without using bedrails?: A Lot Help needed moving to and from a bed to a chair (including a wheelchair)?: A Lot Help needed standing up from a chair using your arms (e.g., wheelchair or bedside chair)?: A Lot Help needed to walk in hospital room?: Total Help needed climbing 3-5 steps with a railing? : Total 6 Click Score: 10    End of Session Equipment Utilized During Treatment: Gait belt Activity Tolerance: Patient tolerated treatment well Patient left: in bed;with call bell/phone within reach Nurse Communication: Mobility status PT Visit Diagnosis: Unsteadiness on feet (R26.81)    Time: 1500-1530 PT Time Calculation (min) (ACUTE ONLY): 30 min   Charges:   PT Evaluation $PT Eval Low Complexity: 1 Low PT Treatments $Therapeutic Activity: 8-22 mins       Etta Grandchild, PT, DPT Acute Rehabilitation Services Pager: [phone] Office: [phone]   Etta Grandchild 09/19/2018, 4:09 PM

## 2018-09-19 NOTE — Progress Notes (Signed)
   Subjective:  Patient reports pain as mild.  Pain well controlled with tylenol and 1 tramadol.  She is voiding.  Denies, fevers, NS or chills, SOB/CP.  Objective:   VITALS:   Vitals:   09/18/18 1941 09/19/18 0009 09/19/18 0412 09/19/18 1009  BP: 140/79 118/64 112/61 134/73  Pulse: 69 67 67 64  Resp: 14 17 16 16   Temp: 98.5 F (36.9 C) 98.2 F (36.8 C) 98.2 F (36.8 C) 98.1 F (36.7 C)  TempSrc: Oral Oral Oral Oral  SpO2: 98% 96% 97% 99%  Weight:      Height:        Neurovascular intact Sensation intact distally Intact pulses distally Dorsiflexion/Plantar flexion intact Incision: dressing C/D/I Compartment soft   Lab Results  Component Value Date   WBC 10.0 09/19/2018   HGB 12.6 09/19/2018   HCT 35.6 (L) 09/19/2018   MCV 93.2 09/19/2018   PLT 284 09/19/2018   BMET No results found for: NA, K, CL, CO2, GLUCOSE, BUN, CREATININE, CALCIUM, GFRNONAA, GFRAA   Assessment/Plan: 1 Day Post-Op   Active Problems:   Closed left tibial fracture   Advance diet Up with therapy - 50% WB to LLE - maintain dressing x 3 days - pain control - bid asa with SCDs for DVT ppx - will dc tomorrow.   Shelby Yang 09/19/2018, 12:04 PM   Maryan Rued, MD [phone]

## 2018-09-20 ENCOUNTER — Encounter (HOSPITAL_COMMUNITY): Payer: Self-pay | Admitting: Orthopedic Surgery

## 2018-09-20 LAB — CBC
HCT: 35.5 % — ABNORMAL LOW (ref 36.0–46.0)
Hemoglobin: 12.1 g/dL (ref 12.0–15.0)
MCH: 31.9 pg (ref 26.0–34.0)
MCHC: 34.1 g/dL (ref 30.0–36.0)
MCV: 93.7 fL (ref 80.0–100.0)
Platelets: 270 10*3/uL (ref 150–400)
RBC: 3.79 MIL/uL — ABNORMAL LOW (ref 3.87–5.11)
RDW: 13.1 % (ref 11.5–15.5)
WBC: 8.6 10*3/uL (ref 4.0–10.5)
nRBC: 0 % (ref 0.0–0.2)

## 2018-09-20 MED ORDER — OXYCODONE HCL 5 MG PO TABS: 5.0000 mg | ORAL_TABLET | Freq: Four times a day (QID) | ORAL | 0 refills | Status: AC | PRN

## 2018-09-20 MED ORDER — TRAMADOL HCL 50 MG PO TABS: 50.0000 mg | ORAL_TABLET | Freq: Four times a day (QID) | ORAL | 0 refills | Status: DC | PRN

## 2018-09-20 MED ORDER — ONDANSETRON 4 MG PO TBDP: 4.0000 mg | ORAL_TABLET | Freq: Three times a day (TID) | ORAL | 0 refills | Status: DC | PRN

## 2018-09-20 NOTE — Discharge Instructions (Signed)
Orthopedic discharge instructions: - Maintain Postoperative bandages until Saturday morning.  He may remove bandages at that time and begin showering.  Do not submerge her leg under water.  Surgical wounds should be covered with dry dressing following shower.  - Okay for 50% weightbearing to the left leg.  You should elevate your leg with "toes above nose."  - Apply ice to the leg for 20-30 minutes at a time for each hour that you are awake.  - For the Prevention of blood clots take an 81 mg aspirin 2 times per day for 6 weeks.  - Return to see Dr. Aundria Rud in the office for staple removal in 2 weeks.

## 2018-09-20 NOTE — Discharge Summary (Signed)
Patient ID: Shelby Yang MRN: [id number] DOB/AGE: April 08, 1976 43 y.o.  Admit date: 09/17/2018 Discharge date:   Primary Diagnosis: Left segmental tibia fracture  Admission Diagnoses:  Past Medical History:  Diagnosis Date  . Asthma   . Environmental allergies   . Prediabetes    Discharge Diagnoses:   Active Problems:   Closed left tibial fracture  Estimated body mass index is 38.21 kg/m as calculated from the following:   Height as of this encounter: 5\' 8"  (1.727 m).   Weight as of this encounter: 114 kg.  Procedure:  Procedure(s) (LRB): INTRAMEDULLARY (IM) NAIL TIBIAL (Left)   Consults: None  HPI: Presented to ED following a fall through a porch with above injury.  Indicated for urgent surgical management and transferred to [facility] hospital for that care. Laboratory Data: Admission on 09/17/2018  Component Date Value Ref Range Status  . MRSA, PCR 09/17/2018 NEGATIVE  NEGATIVE Final  . Staphylococcus aureus 09/17/2018 NEGATIVE  NEGATIVE Final   Comment: (NOTE) The Xpert SA Assay (FDA approved for NASAL specimens in patients 53 years of age and older), is one component of a comprehensive surveillance program. It is not intended to diagnose infection nor to guide or monitor treatment. Performed at [facility], [address], [place], [place] [address]   . hCG, Beta Chain, Quant, S 09/18/2018 <1  <5 mIU/mL Final   Comment:          GEST. AGE      CONC.  (mIU/mL)   <=1 WEEK        5 - 50     2 WEEKS       50 - 500     3 WEEKS       100 - 10,000     4 WEEKS     1,000 - 30,000     5 WEEKS     3,500 - 115,000   6-8 WEEKS     12,000 - 270,000    12 WEEKS     15,000 - 220,000        FEMALE AND NON-PREGNANT FEMALE:     LESS THAN 5 mIU/mL Performed at [facility], [address], [place], [place] [address]   . WBC 09/19/2018 10.0  4.0 - 10.5 K/uL Final  . RBC 09/19/2018 3.82* 3.87 - 5.11 MIL/uL Final  . Hemoglobin 09/19/2018 12.6  12.0 - 15.0 g/dL  Final  . HCT 47/02/6282 35.6* 36.0 - 46.0 % Final  . MCV 09/19/2018 93.2  80.0 - 100.0 fL Final  . MCH 09/19/2018 33.0  26.0 - 34.0 pg Final  . MCHC 09/19/2018 35.4  30.0 - 36.0 g/dL Final  . RDW 66/29/4765 12.8  11.5 - 15.5 % Final  . Platelets 09/19/2018 284  150 - 400 K/uL Final  . nRBC 09/19/2018 0.0  0.0 - 0.2 % Final   Performed at [facility], [address] [place], [place] [address]  . WBC 09/20/2018 8.6  4.0 - 10.5 K/uL Final  . RBC 09/20/2018 3.79* 3.87 - 5.11 MIL/uL Final  . Hemoglobin 09/20/2018 12.1  12.0 - 15.0 g/dL Final  . HCT 54/65/6812 35.5* 36.0 - 46.0 % Final  . MCV 09/20/2018 93.7  80.0 - 100.0 fL Final  . MCH 09/20/2018 31.9  26.0 - 34.0 pg Final  . MCHC 09/20/2018 34.1  30.0 - 36.0 g/dL Final  . RDW 75/17/0017 13.1  11.5 - 15.5 % Final  . Platelets 09/20/2018 270  150 -  400 K/uL Final  . nRBC 09/20/2018 0.0  0.0 - 0.2 % Final   Performed at [facility], [address] [place], [place] [address]     X-Rays:Dg Tibia/fibula Left  Result Date: 09/18/2018 CLINICAL DATA:  Internal fixation EXAM: DG C-ARM 61-120 MIN; LEFT TIBIA AND FIBULA - 2 VIEW COMPARISON:  09/17/2018 FINDINGS: Placement of intramedullary nail across the left tibial fractures. Near anatomic alignment. No hardware or bony complicating feature. IMPRESSION: Internal fixation across the left tibial fractures. No complicating feature. Electronically Signed   By: Charlett Nose M.D.   On: 09/18/2018 12:40   Dg Tibia/fibula Left  Result Date: 09/17/2018 CLINICAL DATA:  Fall today with left leg pain. EXAM: LEFT TIBIA AND FIBULA - 2 VIEW COMPARISON:  None. FINDINGS: Exam demonstrates a mildly displaced slightly oblique fracture of the proximal tibial diaphysis with second oblique slightly comminuted fracture site over the mid to distal tibial diaphysis. Remainder the exam is unremarkable. IMPRESSION: Minimally displaced fractures over the proximal tibial diaphysis as well as second fracture site  with mild comminution over the mid to distal tibial diaphysis. Electronically Signed   By: Elberta Fortis M.D.   On: 09/17/2018 18:01   Dg C-arm 1-60 Min  Result Date: 09/18/2018 CLINICAL DATA:  Internal fixation EXAM: DG C-ARM 61-120 MIN; LEFT TIBIA AND FIBULA - 2 VIEW COMPARISON:  09/17/2018 FINDINGS: Placement of intramedullary nail across the left tibial fractures. Near anatomic alignment. No hardware or bony complicating feature. IMPRESSION: Internal fixation across the left tibial fractures. No complicating feature. Electronically Signed   By: Charlett Nose M.D.   On: 09/18/2018 12:40    EKG:No orders found for this or any previous visit.   Hospital Course: Shelby Yang is a 43 y.o. who was admitted to Hospital. They were brought to the operating room on 09/18/2018 and underwent Procedure(s): INTRAMEDULLARY (IM) NAIL TIBIAL.  Patient tolerated the procedure well and was later transferred to the recovery room and then to the orthopaedic floor for postoperative care.  They were given PO and IV analgesics for pain control following their surgery.  They were given 24 hours of postoperative antibiotics of  Anti-infectives (From admission, onward)   Start     Dose/Rate Route Frequency Ordered Stop   09/18/18 1700  ceFAZolin (ANCEF) IVPB 2g/100 mL premix     2 g 200 mL/hr over 30 Minutes Intravenous Every 6 hours 09/18/18 1306 09/19/18 0708   09/18/18 1000  ceFAZolin (ANCEF) IVPB 2g/100 mL premix     2 g 200 mL/hr over 30 Minutes Intravenous To ShortStay Surgical 09/17/18 2216 09/18/18 1140     and started on DVT prophylaxis in the form of Aspirin.   PT and OT were ordered.  Discharge planning consulted to help with postop disposition and equipment needs.  Patient had a good night on the evening of surgery.  They started to get up OOB with therapy on day one.  By day 2, the patient had progressed with therapy and meeting their goals.  Incision was healing well.  Patient was seen in rounds  and was ready to go home.   Diet: Regular diet Activity: 50 % WB to LLE Follow-up:in 2 weeks Disposition - Home Discharged Condition: good   Discharge Instructions    Call MD / Call 911   Complete by:  As directed    If you experience chest pain or shortness of breath, CALL 911 and be transported to the hospital emergency room.  If you develope  a fever above 101 F, pus (white drainage) or increased drainage or redness at the wound, or calf pain, call your surgeon's office.   Constipation Prevention   Complete by:  As directed    Drink plenty of fluids.  Prune juice may be helpful.  You may use a stool softener, such as Colace (over the counter) 100 mg twice a day.  Use MiraLax (over the counter) for constipation as needed.   Diet - low sodium heart healthy   Complete by:  As directed    Increase activity slowly as tolerated   Complete by:  As directed      Allergies as of 09/20/2018      Reactions   Codeine Anaphylaxis, Hives   Other Anaphylaxis, Shortness Of Breath   NOVACAINE      Medication List    TAKE these medications   aspirin 325 MG tablet Take 162.5 mg by mouth daily as needed for moderate pain.   levothyroxine 150 MCG tablet Commonly known as:  SYNTHROID, LEVOTHROID Take 150 mcg by mouth daily.   ondansetron 4 MG disintegrating tablet Commonly known as:  Zofran ODT Take 1 tablet (4 mg total) by mouth every 8 (eight) hours as needed.   oxyCODONE 5 MG immediate release tablet Commonly known as:  Roxicodone Take 1 tablet (5 mg total) by mouth every 6 (six) hours as needed for severe pain.   traMADol 50 MG tablet Commonly known as:  ULTRAM Take 1-2 tablets (50-100 mg total) by mouth every 6 (six) hours as needed for moderate pain.      Follow-up Information    Yolonda Kida, MD Follow up in 2 week(s).   Specialty:  Orthopedic Surgery Why:  For suture removal Contact information: [address] [place] [address] [phone]         Care, [facility] Follow up.   Specialty:  Home Health Services Why:  A representative from [facility] will contact you to arrange start date and time for your therapy. Contact information: [address] [place] [address] [phone]           Signed: Maryan Rued, MD Orthopaedic Surgery 09/20/2018, 5:55 PM

## 2018-09-20 NOTE — Progress Notes (Signed)
   Subjective:  Patient reports pain as mild.  Pain well controlled.  She is voiding.  Denies, fevers, NS or chills, SOB/CP.  Objective:   VITALS:   Vitals:   09/19/18 1009 09/19/18 1424 09/19/18 2011 09/20/18 0356  BP: 134/73 136/74 125/63 116/74  Pulse: 64 66 69 64  Resp: 16 16 17 17   Temp: 98.1 F (36.7 C) 98.6 F (37 C) 98.6 F (37 C) 98.7 F (37.1 C)  TempSrc: Oral Oral Oral Oral  SpO2: 99% 98% 99% 97%  Weight:      Height:        Neurovascular intact Sensation intact distally Intact pulses distally Dorsiflexion/Plantar flexion intact Incision: dressing C/D/I Compartment soft   Lab Results  Component Value Date   WBC 8.6 09/20/2018   HGB 12.1 09/20/2018   HCT 35.5 (L) 09/20/2018   MCV 93.7 09/20/2018   PLT 270 09/20/2018   BMET No results found for: NA, K, CL, CO2, GLUCOSE, BUN, CREATININE, CALCIUM, GFRNONAA, GFRAA   Assessment/Plan: 2 Days Post-Op   Active Problems:   Closed left tibial fracture   Advance diet Up with therapy - 50% WB to LLE - maintain dressing x 3 days - pain control - bid asa with SCDs for DVT ppx - will dc later today - appreciate OT/PT    Yolonda Kida 09/20/2018, 12:16 PM   Maryan Rued, MD [phone]

## 2018-09-20 NOTE — Evaluation (Addendum)
Occupational Therapy Evaluation Patient Details Name: Shelby Yang MRN: [id number] DOB: 19-Mar-1976 Today's Date: 09/20/2018    History of Present Illness Patient POD1 s/p Tibial IM nail from fall at home 3/31.  Patient is 50% WB LLE   Clinical Impression   Pt is typically independent. Presents with generalized weakness, L LE pain and impaired balance. She requires min to mod assist for mobility and set up to max assist for ADL. Pt has several steps at home she needs to manage prior to return. Will follow acutely.    Follow Up Recommendations  Home health OT;Supervision/Assistance - 24 hour    Equipment Recommendations  3 in 1 bedside commode;Wheelchair (measurements OT);Wheelchair cushion (measurements OT)    Recommendations for Other Services       Precautions / Restrictions Precautions Precautions: Fall Required Braces or Orthoses: Other Brace Other Brace: L CAM boot Restrictions Weight Bearing Restrictions: Yes LLE Weight Bearing: Partial weight bearing LLE Partial Weight Bearing Percentage or Pounds: 50      Mobility Bed Mobility Overal bed mobility: Needs Assistance Bed Mobility: Supine to Sit     Supine to sit: Mod assist     General bed mobility comments: assist to raise trunk and manage L UE  Transfers Overall transfer level: Needs assistance Equipment used: Rolling walker (2 wheeled) Transfers: Sit to/from [employer] Sit to Stand: Min assist         General transfer comment: cues for technique, hand placement and assist to rise and steady    Balance Overall balance assessment: Needs assistance   Sitting balance-Leahy Scale: Fair       Standing balance-Leahy Scale: Poor                             ADL either performed or assessed with clinical judgement   ADL Overall ADL's : Needs assistance/impaired Eating/Feeding: Independent;Bed level   Grooming: Wash/dry hands;Sitting;Set up   Upper Body Bathing:  Set up;Sitting   Lower Body Bathing: Maximal assistance;Sit to/from stand   Upper Body Dressing : Set up;Sitting   Lower Body Dressing: Maximal assistance;Sit to/from stand   Toilet Transfer: Minimal assistance;RW;BSC;Ambulation   Toileting- Clothing Manipulation and Hygiene: Minimal assistance;Sit to/from stand               Vision Baseline Vision/History: No visual deficits       Perception     Praxis      Pertinent Vitals/Pain Pain Assessment: 0-10 Pain Score: 5  Pain Location: L Leg Pain Descriptors / Indicators: Aching;Discomfort;Moaning Pain Intervention(s): Monitored during session;Premedicated before session;Repositioned     Hand Dominance Right   Extremity/Trunk Assessment Upper Extremity Assessment Upper Extremity Assessment: Overall WFL for tasks assessed   Lower Extremity Assessment Lower Extremity Assessment: Defer to PT evaluation       Communication Communication Communication: No difficulties   Cognition Arousal/Alertness: Awake/alert Behavior During Therapy: Anxious Overall Cognitive Status: Within Functional Limits for tasks assessed                                     General Comments       Exercises     Shoulder Instructions      Home Living Family/patient expects to be discharged to:: Private residence Living Arrangements: Spouse/significant other;Children;Non-relatives/Friends(fiance, 2 adult children and fiance's dad) Available Help at Discharge: Family;Available 24 hours/day Type of Home: House Home  Access: Stairs to enter [employer] of Steps: 6 Entrance Stairs-Rails: Can reach both Home Layout: One level     Bathroom Shower/Tub: Chief Strategy Officer: Standard     Home Equipment: None   Additional Comments: pt with BSC, RW in room      Prior Functioning/Environment Level of Independence: Independent        Comments: works as Production designer, theatre/television/film at [employer]        OT  Problem List: Decreased strength;Decreased activity tolerance;Impaired balance (sitting and/or standing);Decreased knowledge of use of DME or AE;Pain      OT Treatment/Interventions: Self-care/ADL training;DME and/or AE instruction;Patient/family education;Balance training;Therapeutic activities    OT Goals(Current goals can be found in the care plan section) Acute Rehab OT Goals Patient Stated Goal: go home OT Goal Formulation: With patient Time For Goal Achievement: 10/04/18 Potential to Achieve Goals: Good  OT Frequency: Min 3X/week   Barriers to D/C:            Co-evaluation              AM-PAC OT "6 Clicks" Daily Activity     Outcome Measure Help from another person eating meals?: None Help from another person taking care of personal grooming?: A Little Help from another person toileting, which includes using toliet, bedpan, or urinal?: A Little Help from another person bathing (including washing, rinsing, drying)?: A Lot Help from another person to put on and taking off regular upper body clothing?: A Little Help from another person to put on and taking off regular lower body clothing?: A Lot 6 Click Score: 17   End of Session Equipment Utilized During Treatment: Gait belt;Rolling walker;Other (comment)(CAM boot) Nurse Communication: Mobility status  Activity Tolerance: Patient limited by pain Patient left: with call bell/phone within reach;in chair  OT Visit Diagnosis: Unsteadiness on feet (R26.81);Other abnormalities of gait and mobility (R26.89);Pain;History of falling (Z91.81);Muscle weakness (generalized) (M62.81)                Time: 3546-5681 OT Time Calculation (min): 39 min Charges:  OT General Charges $OT Visit: 1 Visit OT Evaluation  $OT Eval Moderate Complexity: 1 Mod OT Treatments $Self Care/Home Management : 23-37 mins  Martie Round, OTR/L Acute Rehabilitation Services Pager: [phone] Office: [phone] Evern Bio 09/20/2018, 12:08 PM

## 2018-09-20 NOTE — Progress Notes (Signed)
PT Cancellation Note  Patient Details Name: Shelby Yang MRN: [id number] DOB: 1975/06/25   Cancelled Treatment:    Reason Eval/Treat Not Completed: Patient declined, no reason specified  Patient declined due to pain and reports "I did too much already". Pt encouraged to participate in therapy given pt expressing that she does not know "how to use the walker" and that she will likely not use it at home. Pt educated on need for RW due to 50% PWB status. Pt has 6 stairs to enter home and encouraged to participate in stair training however pt declined despite wanting to d/c home. PT will continue to follow acutely.    Derek Mound, PTA Acute Rehabilitation Services Pager: [phone] Office: [phone]   09/20/2018, 3:27 PM

## 2018-09-20 NOTE — Plan of Care (Signed)
  Problem: Pain Managment: Goal: General experience of comfort will improve Outcome: Progressing   

## 2019-03-24 ENCOUNTER — Emergency Department (HOSPITAL_COMMUNITY)
Admission: EM | Admit: 2019-03-24 | Discharge: 2019-03-24 | Disposition: A | Discharge status: Home / Self Care | Payer: Self-pay | Attending: Emergency Medicine | Admitting: Emergency Medicine

## 2019-03-24 ENCOUNTER — Emergency Department (HOSPITAL_COMMUNITY): Payer: Self-pay

## 2019-03-24 ENCOUNTER — Other Ambulatory Visit: Payer: Self-pay

## 2019-03-24 ENCOUNTER — Encounter (HOSPITAL_COMMUNITY): Payer: Self-pay

## 2019-03-24 DIAGNOSIS — W172XXA Fall into hole, initial encounter: Secondary | ICD-10-CM | POA: Insufficient documentation

## 2019-03-24 DIAGNOSIS — R030 Elevated blood-pressure reading, without diagnosis of hypertension: Secondary | ICD-10-CM | POA: Insufficient documentation

## 2019-03-24 DIAGNOSIS — Z6836 Body mass index (BMI) 36.0-36.9, adult: Secondary | ICD-10-CM | POA: Insufficient documentation

## 2019-03-24 DIAGNOSIS — S93492A Sprain of other ligament of left ankle, initial encounter: Secondary | ICD-10-CM | POA: Insufficient documentation

## 2019-03-24 DIAGNOSIS — Z79899 Other long term (current) drug therapy: Secondary | ICD-10-CM | POA: Insufficient documentation

## 2019-03-24 DIAGNOSIS — Y92017 Garden or yard in single-family (private) house as the place of occurrence of the external cause: Secondary | ICD-10-CM | POA: Insufficient documentation

## 2019-03-24 DIAGNOSIS — Y999 Unspecified external cause status: Secondary | ICD-10-CM | POA: Insufficient documentation

## 2019-03-24 DIAGNOSIS — E669 Obesity, unspecified: Secondary | ICD-10-CM | POA: Insufficient documentation

## 2019-03-24 DIAGNOSIS — Y93H2 Activity, gardening and landscaping: Secondary | ICD-10-CM | POA: Insufficient documentation

## 2019-03-24 NOTE — ED Notes (Signed)
Patient Alert and oriented to baseline. Stable and ambulatory to baseline. Patient verbalized understanding of the discharge instructions.  Patient belongings were taken by the patient.   

## 2019-03-24 NOTE — Discharge Instructions (Addendum)
You have been diagnosed today with left ankle sprain and elevated blood pressure reading.  At this time there does not appear to be the presence of an emergent medical condition, however there is always the potential for conditions to change. Please read and follow the below instructions.  Please return to the Emergency Department immediately for any new or worsening symptoms. Please be sure to follow up with your Primary Care Provider within one week regarding your visit today; please call their office to schedule an appointment even if you are feeling better for a follow-up visit. Please use the brace and crutches given to you today to avoid further injury of your left ankle.  As we discussed it is likely that you suffered a sprain of your left ankle today.  However unseen fractures may be present and orthopedic follow-up is advised.  Please call Dr. Stann Mainland tomorrow morning to schedule a follow-up appointment for further evaluation and treatment of your left ankle pain.  You may use rest, ice and elevation to help with your symptoms. Additionally your blood pressure was elevated today.  Please be sure to follow-up with your primary care provider this week for blood pressure recheck and possible medication management.  Get help right away if: You cannot feel your toes or foot. Your foot or toes look blue. You have very bad pain that gets worse. You have chest pain or shortness of breath Get a very bad headache. Start to feel mixed up (confused). Feel weak or numb. Feel faint. Have very bad pain in your: Chest. Belly (abdomen). Throw up more than once. Have trouble breathing. You have any new/concerning or worsening symptoms  Please read the additional information packets attached to your discharge summary.  Do not take your medicine if  develop an itchy rash, swelling in your mouth or lips, or difficulty breathing; call 911 and seek immediate emergency medical attention if this  occurs.  Note: Portions of this text may have been transcribed using voice recognition software. Every effort was made to ensure accuracy; however, inadvertent computerized transcription errors may still be present.

## 2019-03-24 NOTE — ED Triage Notes (Signed)
Patient reports recently recovering from tib/fib fracture several months ago and fell in hall a few days ago injuring left ankle and foot.  Swelling to same

## 2019-03-24 NOTE — ED Provider Notes (Signed)
[facility] Provider Note   CSN: [id number] Arrival date & time: 03/24/19  1201     History   Chief Complaint No chief complaint on file.   HPI Shelby Yang is a 43 y.o. female presents today for left ankle pain that began yesterday.  Patient reports that she was doing yard work when she slipped into a hole.  She has had pain of the left medial malleolus since her fall, she reports it has somewhat improved since that time she now describes a moderate intensity aching sensation constant worsened with movement palpation and without alleviating factors, she reports some swelling over the area of pain.  Patient reports that pain is improved with wearing her shoe and with elevation.  Of note patient had left tibial fracture in March 2020 repaired by Dr. Aundria Rudogers, patient reports she recently came out of her boot 2 weeks ago.  Patient denies head injury, loss of consciousness, blood thinner use, neck pain, chest pain, back pain, abdominal pain, nausea/vomiting, pain of the upper extremities, right lower extremity pain, hip pain, knee pain, color change, wound or any additional concerns.    HPI  Past Medical History:  Diagnosis Date  . Asthma   . Environmental allergies   . Prediabetes     Patient Active Problem List   Diagnosis Date Noted  . Closed left tibial fracture 09/17/2018    Past Surgical History:  Procedure Laterality Date  . radiation to thyroid  1999  . TIBIA IM NAIL INSERTION Left 09/18/2018   Procedure: INTRAMEDULLARY (IM) NAIL TIBIAL;  Surgeon: Yolonda Kidaogers, Jason Patrick, MD;  [facility];  Service: Orthopedics;  Laterality: Left;  . TONSILLECTOMY  1993     OB History   No obstetric history on file.      Home Medications    Prior to Admission medications   Medication Sig Start Date End Date Taking? Authorizing Provider  aspirin 325 MG tablet Take 162.5 mg by mouth daily as needed for moderate pain.    [provider]  levothyroxine (SYNTHROID, LEVOTHROID) 150 MCG tablet Take 150 mcg by mouth daily. 09/13/18   [provider]  ondansetron (ZOFRAN ODT) 4 MG disintegrating tablet Take 1 tablet (4 mg total) by mouth every 8 (eight) hours as needed. 09/20/18   Yolonda Kidaogers, Jason Patrick, MD  oxyCODONE (ROXICODONE) 5 MG immediate release tablet Take 1 tablet (5 mg total) by mouth every 6 (six) hours as needed for severe pain. 09/20/18 09/20/19  Yolonda Kidaogers, Jason Patrick, MD  traMADol (ULTRAM) 50 MG tablet Take 1-2 tablets (50-100 mg total) by mouth every 6 (six) hours as needed for moderate pain. 09/20/18   Yolonda Kidaogers, Jason Patrick, MD    Family History No family history on file.  Social History Social History   Tobacco Use  . Smoking status: Never Smoker  . Smokeless tobacco: Never Used  Substance Use Topics  . Alcohol use: Never    Frequency: Never  . Drug use: Never     Allergies   Codeine and Other   Review of Systems Review of Systems Ten systems are reviewed and are negative for acute change except as noted in the HPI   Physical Exam Updated Vital Signs BP (!) 165/87 (BP Location: Left Arm)   Pulse 72   Temp 98.6 F (37 C) (Oral)   Resp 12   Ht 5\' 4"  (1.626 m)   Wt 97.5 kg   LMP 03/06/2019 (Approximate)   SpO2 97%  BMI 36.90 kg/m   Physical Exam Constitutional:      General: She is not in acute distress.    Appearance: Normal appearance. She is well-developed. She is obese. She is not ill-appearing or diaphoretic.  HENT:     Head: Normocephalic and atraumatic.     Right Ear: External ear normal.     Left Ear: External ear normal.     Nose: Nose normal.  Eyes:     General: Vision grossly intact. Gaze aligned appropriately.     Pupils: Pupils are equal, round, and reactive to light.  Neck:     Musculoskeletal: Normal range of motion.     Trachea: Trachea and phonation normal. No tracheal deviation.  Pulmonary:     Effort: Pulmonary effort is normal. No respiratory  distress.  Abdominal:     General: There is no distension.     Palpations: Abdomen is soft.     Tenderness: There is no abdominal tenderness. There is no guarding or rebound.  Musculoskeletal: Normal range of motion.     Left hip: She exhibits normal range of motion and normal strength.     Left knee: She exhibits normal range of motion. No tenderness found.     Left ankle: She exhibits swelling (Minimal). She exhibits normal range of motion and no deformity. Tenderness. Medial malleolus tenderness found. Achilles tendon normal.     Comments: Patient with some tenderness to the left medial malleolus with minimal swelling and without color change.  She has full range of motion with dorsiflexion/plantar flexion, inversion/eversion with some increase in pain.  Movement of the toes, knee and hip of the left lower extremity are intact without pain.  Pedal pulses intact, capillary refill and sensation intact to all toes.  Patient reports that she has occasional paresthesias of the left great toe but reports this is chronic since her previous surgery and is without change.  Skin is intact.  Compartments are soft.  Skin:    General: Skin is warm and dry.  Neurological:     Mental Status: She is alert.     GCS: GCS eye subscore is 4. GCS verbal subscore is 5. GCS motor subscore is 6.     Comments: Speech is clear and goal oriented, follows commands Major Cranial nerves without deficit, no facial droop Moves extremities without ataxia, coordination intact  Psychiatric:        Behavior: Behavior normal.    ED Treatments / Results  Labs (all labs ordered are listed, but only abnormal results are displayed) Labs Reviewed - No data to display  EKG None  Radiology Dg Ankle Complete Left  Result Date: 03/24/2019 CLINICAL DATA:  Fall a few days ago with left ankle injury. EXAM: LEFT ANKLE COMPLETE - 3+ VIEW COMPARISON:  None. FINDINGS: Generalized soft tissue swelling over the left ankle. Visualized  intramedullary nail and screws over the distal tibia intact. Old distal tibial diaphyseal fracture. Ankle mortise is normal. Mild degenerate change over the midfoot. No acute fracture or dislocation. IMPRESSION: No acute findings. Electronically Signed   By: Marin Olp M.D.   On: 03/24/2019 13:23   Dg Foot Complete Left  Result Date: 03/24/2019 CLINICAL DATA:  Left foot pain and swelling over the past few days. EXAM: LEFT FOOT - COMPLETE 3+ VIEW COMPARISON:  None. FINDINGS: No evidence of fracture or dislocation. Minimal degenerative change over the midfoot. Partially visualized hardware intact over the distal tibia. IMPRESSION: No acute findings. Electronically Signed   By: Quillian Quince  Micheline Maze M.D.   On: 03/24/2019 13:25    Procedures Procedures (including critical care time)  Medications Ordered in ED Medications - No data to display   Initial Impression / Assessment and Plan / ED Course  I have reviewed the triage vital signs and the nursing notes.  Pertinent labs & imaging results that were available during my care of the patient were reviewed by me and considered in my medical decision making (see chart for details).    Patient is neurovascularly intact to the lower extremity without sign of cellulitis, septic arthritis, DVT or compartment syndrome.  Range of motion and strength intact at the left hip, left knee and left toes without pain.  She has full range of motion and appropriate strength with all movements of the left ankle but does have a some increased pain with these movements.  Capillary refill and sensation is intact along with pedal pulses.  DG left foot:  IMPRESSION:  No acute findings.   DG left ankle:  IMPRESSION:  No acute findings.  - Patient updated on findings today and states understanding.  Suspect patient with sprain of the left ankle.  She will be placed in an ASO brace and given crutches today.  She will be referred to orthopedic surgeon Dr. Aundria Rud for further  evaluation.  Patient informed that unseen fractures may be present and orthopedic follow-up is recommended for reevaluation within the next week.  Discussed rice therapy.  Patient advised of elevated blood pressure today and need for improved management.  She is to follow-up with PCP within 1 week for blood pressure recheck and possible medication management.  Patient asymptomatic today, she has been informed of signs/symptoms of hypertensive urgency/emergency and to return to emergency department immediately if they occur.  At this time there does not appear to be any evidence of an acute emergency medical condition and the patient appears stable for discharge with appropriate outpatient follow up. Diagnosis was discussed with patient who verbalizes understanding of care plan and is agreeable to discharge. I have discussed return precautions with patient who verbalizes understanding of return precautions. Patient encouraged to follow-up with their PCP and ortho. All questions answered.  Note: Portions of this report may have been transcribed using voice recognition software. Every effort was made to ensure accuracy; however, inadvertent computerized transcription errors may still be present. Final Clinical Impressions(s) / ED Diagnoses   Final diagnoses:  Sprain of anterior talofibular ligament of left ankle, initial encounter  Elevated blood pressure reading    ED Discharge Orders    None       Elizabeth Palau 03/24/19 1505    Milagros Loll, MD 03/25/19 [phone]

## 2019-03-24 NOTE — Progress Notes (Signed)
Orthopedic Tech Progress Note Patient Details:  Shelby Yang 10-Jul-1975 [id number]  Ortho Devices Type of Ortho Device: ASO Ortho Device/Splint Location: left Ortho Device/Splint Interventions: Application   Post Interventions Patient Tolerated: Well Instructions Provided: Care of device   [place] Pink 03/24/2019, 5:17 PM

## 2019-11-21 ENCOUNTER — Other Ambulatory Visit: Payer: Self-pay

## 2019-11-21 ENCOUNTER — Encounter: Payer: Self-pay | Admitting: Physical Therapy

## 2019-11-21 ENCOUNTER — Ambulatory Visit: Payer: Self-pay | Attending: Orthopedic Surgery | Admitting: Physical Therapy

## 2019-11-21 DIAGNOSIS — G8929 Other chronic pain: Secondary | ICD-10-CM | POA: Insufficient documentation

## 2019-11-21 DIAGNOSIS — M6281 Muscle weakness (generalized): Secondary | ICD-10-CM | POA: Insufficient documentation

## 2019-11-21 DIAGNOSIS — M25662 Stiffness of left knee, not elsewhere classified: Secondary | ICD-10-CM | POA: Insufficient documentation

## 2019-11-21 DIAGNOSIS — R2689 Other abnormalities of gait and mobility: Secondary | ICD-10-CM | POA: Insufficient documentation

## 2019-11-21 DIAGNOSIS — M25562 Pain in left knee: Secondary | ICD-10-CM | POA: Insufficient documentation

## 2019-11-21 NOTE — Patient Instructions (Signed)
Access Code: [email or web address] URL: [email or web address] Date: 11/21/2019 Prepared by: Rosana Hoes  Exercises Ankle Pumps in Elevation - 5 x daily - 7 x weekly - 10 minutes hold Supine Quad Set - 5 x daily - 7 x weekly - 10 reps - 5 seconds hold Supine Heel Slide with Strap - 5 x daily - 7 x weekly - 10 reps - 5 seconds hold Long Sitting Calf Stretch with Strap - 5 x daily - 7 x weekly - 2-3 reps - 20 seconds hold

## 2019-11-21 NOTE — Therapy (Addendum)
[place], [place], [address] Phone: [phone]   Fax:  [phone]  Physical Therapy Evaluation / Discharge  Patient Details  Name: Shelby Yang MRN: [id number] Date of Birth: 20-Nov-1975 Referring Provider (PT): Nicholes Stairs, MD   Encounter Date: 11/21/2019  PT End of Session - 11/21/19 1525    Visit Number  1    Number of Visits  16    Date for PT Re-Evaluation  01/16/20    Authorization Type  Self Pay - applying for CAFA    PT Start Time  1530    PT Stop Time  1615    PT Time Calculation (min)  45 min    Activity Tolerance  Patient tolerated treatment well;Patient limited by pain    Behavior During Therapy  [facility] for tasks assessed/performed       Past Medical History:  Diagnosis Date  . Asthma   . Environmental allergies   . Prediabetes     Past Surgical History:  Procedure Laterality Date  . radiation to thyroid  1999  . TIBIA IM NAIL INSERTION Left 09/18/2018   Procedure: INTRAMEDULLARY (IM) NAIL TIBIAL;  Surgeon: Nicholes Stairs, MD;  Location: [facility];  Service: Orthopedics;  Laterality: Left;  . TONSILLECTOMY  1993    There were no vitals filed for this visit.   Subjective Assessment - 11/21/19 1524    Subjective  Patient reports she fell on 09/18/2018 and broker her leg in 2 places, she had surgery on the next day to put a rod on her leg. She had constant swelling after the surgery and only had 4 HHPT visits. She still has swelling, cannot squat, has not sat on the floor, cannot climb or go down step normally, and her mobility is very limited. She has pain constantly her knee is very stiff especially when she has been off feet for a period of time.    Pertinent History  BMI    Limitations  Standing;Lifting;Walking;House hold activities    How long can you sit comfortably?  No limitation    How long can you stand comfortably?  30 minutes    How long can you walk  comfortably?  30 minutes    Diagnostic tests  X-ray    Patient Stated Goals  Improve mobility and get back to work    Currently in Pain?  Yes    Pain Score  5     Pain Location  Knee    Pain Orientation  Left    Pain Descriptors / Indicators  Tightness   "rubber band pulled too tight on skin"   Pain Type  Chronic pain;Surgical pain    Pain Onset  More than a month ago    Pain Frequency  Intermittent    Aggravating Factors   Standing, walking, stairs, bending knee    Pain Relieving Factors  None reported, medication    Effect of Pain on Daily Activities  Patient feels limited in all mobility         [facility] PT Assessment - 11/21/19 0001      Assessment   Medical Diagnosis  Fracture of shaft of left tibia s/p IM nail    Referring Provider (PT)  Nicholes Stairs, MD    Onset Date/Surgical Date  09/18/19    Next MD Visit  Not scheduled    Prior Therapy  HHPT      Precautions   Precautions  None  Restrictions   Weight Bearing Restrictions  No      Balance Screen   Has the patient fallen in the past 6 months  No    Has the patient had a decrease in activity level because of a fear of falling?   Yes    Is the patient reluctant to leave their home because of a fear of falling?   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, in order to return to working she has to be able to lift > 50 lbs, climb ladders, and Tax adviser the house, cooking dinner      Cognition   Overall Cognitive Status  Within Functional Limits for tasks assessed      Observation/Other Assessments   Observations  Patient appears in no apparent distress    Focus on Therapeutic Outcomes (FOTO)   79% limitation      Sensation   Light Touch  Appears Intact      ROM / Strength   AROM / PROM /  Strength  AROM;PROM;Strength      AROM   AROM Assessment Site  Knee;Ankle    Right/Left Knee  Right;Left    Right Knee Extension  0    Right Knee Flexion  125    Left Knee Extension  0    Left Knee Flexion  110    Right/Left Ankle  Right;Left    Right Ankle Dorsiflexion  8    Left Ankle Dorsiflexion  0      PROM   Overall PROM Comments  Not assessed due to increased pain      Strength   Overall Strength Comments  Increased anterior knee pain with all muscle testing, diffculty activating quad and unable to perform SLR on left    Strength Assessment Site  Knee    Right/Left Knee  Right;Left    Right Knee Flexion  5/5    Right Knee Extension  5/5    Left Knee Flexion  4-/5    Left Knee Extension  2/5      Palpation   Patella mobility  Hypomobile on left in all directions    Palpation comment  Patellar tendon, tibial tuberosity, peripatellar region      Special Tests   Other special tests  None performed      Ambulation/Gait   Ambulation/Gait  Yes    Ambulation/Gait Assistance  6: Modified independent (Device/Increase time)    Gait Comments  Antalgic on left                  Objective measurements completed on examination: See above findings.      [facility] PT Treatment/Exercise - 11/21/19 0001      Exercises   Exercises  Knee/Hip      Knee/Hip Exercises: Stretches   Knee: Self-Stretch Limitations  Supine heel slide 5 sec hold x 10 with strap    Gastroc Stretch  2 reps;30 seconds    Gastroc Stretch Limitations  longsitting with strap      Knee/Hip Exercises: Supine   Quad Sets  10 reps   5 sec hold   Quad Sets Limitations  towel under knee for cue  PT Education - 11/21/19 1524    Education Details  Exam findings, POC, HEP    Person(s) Educated  Patient    Methods  Explanation;Demonstration;Tactile cues;Verbal cues;Handout    Comprehension  Verbalized understanding;Returned demonstration;Verbal cues required;Tactile cues  required;Need further instruction       PT Short Term Goals - 11/21/19 1525      PT SHORT TERM GOAL #1   Title  Patient will be I with initial HEP to progress in PT    Time  4    Period  Weeks    Status  New    Target Date  12/19/19      PT SHORT TERM GOAL #2   Title  Patient will exhibit left knee AROM equal to opposite side to improve gait and mobility    Time  4    Period  Weeks    Status  New    Target Date  12/19/19      PT SHORT TERM GOAL #3   Title  Patient will be able to perform SLR without extension lag to reduce fall risk with walking or stairs    Time  4    Period  Weeks    Status  New    Target Date  12/19/19      PT SHORT TERM GOAL #4   Title  Patient will report improve pain level to </= 3/10 with activity to improve mobility    Time  4    Period  Weeks    Status  New    Target Date  12/19/19      PT SHORT TERM GOAL #5   Title  Patient will be able to get down and up from floor with little to no difficulty    Time  4    Period  Weeks    Status  New    Target Date  12/19/19        PT Long Term Goals - 11/21/19 1526      PT LONG TERM GOAL #1   Title  Patient will be I with final HEP to maintain progress from PT    Time  8    Period  Weeks    Status  New    Target Date  01/16/20      PT LONG TERM GOAL #2   Title  Patient will report improved functional level to </= 44% on FOTO    Time  8    Period  Weeks    Status  New    Target Date  01/16/20      PT LONG TERM GOAL #3   Title  Patient will exhibit >/= 4+/5 MMT of left knee to improve stair and ladder negotiation to return to work    Time  8    Period  Weeks    Status  New    Target Date  01/16/20      PT LONG TERM GOAL #4   Title  Patient will report ability to walk >1 mile with on a little difficulty to improve shopping and community access    Time  8    Period  Weeks    Status  New    Target Date  01/16/20      PT LONG TERM GOAL #5   Title  Patient will be able to stand > 1  hour without needing rest break to improve ability to return to work    Time  8  Period  Weeks    Status  New    Target Date  01/16/20             Plan - 11/21/19 1526    Clinical Impression Statement  Patient presents to PT with report of left knee pain and weakness following left tibial IM nail surgery on 09/18/2018. She exhibits limitation in left knee and ankle motion, general LLE strength deficit leading to difficulty performing tasks such as squatting and stairs, impaired balance and gait deviations, and swelling of left lower leg. She was provided exercises to work on knee range of motion and calf flexibility, initiate quad activation, and reduce swelling. She would benefit from continued skilled PT to progress her motion and strength in order to improve her overall mobility and ability to return to work.    Personal Factors and Comorbidities  Time since onset of injury/illness/exacerbation;Comorbidity 1;Past/Current Experience;Fitness    Comorbidities  BMI    Examination-Activity Limitations  Locomotion Level;Carry;Lift;Stand;Stairs;Squat    Examination-Participation Restrictions  Cleaning;Community Activity;Shop;Yard Work;Laundry    Stability/Clinical Decision Making  Evolving/Moderate complexity    Clinical Decision Making  Moderate    Rehab Potential  Good    PT Frequency  2x / week    PT Duration  8 weeks    PT Treatment/Interventions  ADLs/Self Care Home Management;Cryotherapy;Electrical Stimulation;Moist Heat;Iontophoresis 20m/ml Dexamethasone;Neuromuscular re-education;Balance training;Therapeutic exercise;Therapeutic activities;Functional mobility training;Stair training;Gait training;Patient/family education;Manual techniques;Dry needling;Passive range of motion;Scar mobilization;Taping;Joint Manipulations    PT Next Visit Plan  Assess HEP and progress PRN, calf and knee range of motion, patellar mobs, quad activiation - use e-stim as needed, initiate hip strengthening as  able, modalities for pain    PT Home Exercise Plan  [email or web address]: leg elevation with ankle pumps, long sitting calf stretch, heel slide with strap, quad set with towel under knee    Consulted and Agree with Plan of Care  Patient       Patient will benefit from skilled therapeutic intervention in order to improve the following deficits and impairments:  Abnormal gait, Decreased range of motion, Decreased activity tolerance, Pain, Increased edema, Decreased strength, Decreased balance, Impaired flexibility  Visit Diagnosis: Chronic pain of left knee  Stiffness of left knee, not elsewhere classified  Muscle weakness (generalized)  Other abnormalities of gait and mobility     Problem List Patient Active Problem List   Diagnosis Date Noted  . Closed left tibial fracture 09/17/2018    CHilda Blades PT, DPT, LAT, ATC 11/21/19  5:16 PM Phone: [phone]: 3[facility] [place] [address]Phone: [phone]  Fax:  [phone] Name: YOthello SgroiMRN: [id number] of Birth: 510/23/1977  PHYSICAL THERAPY DISCHARGE SUMMARY  Visits from Start of Care: 1  Current functional level related to goals / functional outcomes: Patient never returned for follow-up   Remaining deficits: Patient never returned for follow-up   Education / Equipment: HEP Plan: Patient agrees to discharge.  Patient goals were not met. Patient is being discharged due to not returning since the last visit.  ?????     CHilda Blades PT, DPT, LAT, ATC 12/12/19  5:28 PM Phone: [phone]: [phone]

## 2019-12-02 ENCOUNTER — Telehealth: Payer: Self-pay | Admitting: Physical Therapy

## 2019-12-02 ENCOUNTER — Ambulatory Visit: Payer: Self-pay | Admitting: Physical Therapy

## 2019-12-02 NOTE — Telephone Encounter (Signed)
Patient contacted due to missing her appointment. She stated she did not know she had the appointment scheduled and she appologized for the confusion. The patient was informed she has an appointment scheduled for 12/04/2019 and she was reminded of the attendance policy. Patient expressed understanding and stated she would attend her next appointment.

## 2019-12-04 ENCOUNTER — Ambulatory Visit: Payer: Self-pay | Admitting: Physical Therapy

## 2019-12-04 ENCOUNTER — Telehealth: Payer: Self-pay | Admitting: Physical Therapy

## 2019-12-04 NOTE — Telephone Encounter (Signed)
Attempted to contact patient due to missed appointment. Left VM instructing patient that this was her second consecutive missed appointment and per the attendance policy if she missed another appointment then she would be discharged from therapy. Patient reminded that her next appointment was on Tuesday, June 22nd at 3:30pm.

## 2019-12-10 ENCOUNTER — Ambulatory Visit: Payer: Self-pay | Admitting: Physical Therapy

## 2019-12-10 ENCOUNTER — Telehealth: Payer: Self-pay | Admitting: Physical Therapy

## 2019-12-10 NOTE — Telephone Encounter (Signed)
Attempted to contact patient due to missing PT appointment. VM left for patient informing her this was her 3rd straight no-show, and that due to the attendance policy all of her appointments except for her next appointment will be cancelled. PT instructed that her next appointment is scheduled for 12/12/2019 at 3:30 and that if she misses that appointment that she would not be able to reschedule and would need a new PT referral prior to rescheduling any new PT visits.

## 2019-12-12 ENCOUNTER — Telehealth: Payer: Self-pay | Admitting: Physical Therapy

## 2019-12-12 ENCOUNTER — Ambulatory Visit: Payer: Self-pay | Admitting: Physical Therapy

## 2019-12-12 NOTE — Telephone Encounter (Signed)
Attempted to contact patient due to missed PT appointment. Left VM informing patient that this was her 4th no-show in a row so she will be discharged from PT and she would need a new PT referral from her doctor in order to schedule any more appointments.

## 2019-12-17 ENCOUNTER — Ambulatory Visit: Payer: Self-pay | Admitting: Physical Therapy

## 2019-12-19 ENCOUNTER — Encounter: Payer: Self-pay | Admitting: Physical Therapy

## 2019-12-24 ENCOUNTER — Encounter: Payer: Self-pay | Admitting: Physical Therapy

## 2019-12-25 ENCOUNTER — Emergency Department (HOSPITAL_COMMUNITY): Payer: Self-pay

## 2019-12-25 ENCOUNTER — Other Ambulatory Visit: Payer: Self-pay

## 2019-12-25 ENCOUNTER — Emergency Department (HOSPITAL_COMMUNITY)
Admission: EM | Admit: 2019-12-25 | Discharge: 2019-12-25 | Disposition: A | Discharge status: Home / Self Care | Payer: Self-pay | Attending: Emergency Medicine | Admitting: Emergency Medicine

## 2019-12-25 ENCOUNTER — Emergency Department (HOSPITAL_BASED_OUTPATIENT_CLINIC_OR_DEPARTMENT_OTHER): Payer: Self-pay

## 2019-12-25 DIAGNOSIS — W19XXXA Unspecified fall, initial encounter: Secondary | ICD-10-CM

## 2019-12-25 DIAGNOSIS — W010XXA Fall on same level from slipping, tripping and stumbling without subsequent striking against object, initial encounter: Secondary | ICD-10-CM | POA: Insufficient documentation

## 2019-12-25 DIAGNOSIS — R2242 Localized swelling, mass and lump, left lower limb: Secondary | ICD-10-CM | POA: Insufficient documentation

## 2019-12-25 DIAGNOSIS — M25511 Pain in right shoulder: Secondary | ICD-10-CM | POA: Insufficient documentation

## 2019-12-25 DIAGNOSIS — M7989 Other specified soft tissue disorders: Secondary | ICD-10-CM

## 2019-12-25 DIAGNOSIS — Z7982 Long term (current) use of aspirin: Secondary | ICD-10-CM | POA: Insufficient documentation

## 2019-12-25 DIAGNOSIS — J45909 Unspecified asthma, uncomplicated: Secondary | ICD-10-CM | POA: Insufficient documentation

## 2019-12-25 LAB — POC URINE PREG, ED: Preg Test, Ur: NEGATIVE

## 2019-12-25 NOTE — Progress Notes (Signed)
Left lower extremity venous duplex completed. Refer to "CV Proc" under chart review to view preliminary results.  12/25/2019 4:19 PM Eula Fried., MHA, RVT, RDCS, RDMS

## 2019-12-25 NOTE — ED Triage Notes (Signed)
Pt reports bedroom shoe got caught in her boyfriend's uneven flooring. Fell on right knee, right shoulder/arm, elbow.

## 2019-12-25 NOTE — ED Provider Notes (Signed)
[facility]-EMERGENCY DEPT Provider Note   CSN: [id number] Arrival date & time: 12/25/19  1336     History Chief Complaint  Patient presents with  . Fall  . Leg Pain  . Shoulder Pain    Shelby NashYvette Yang is a 44 y.o. female with history of left leg tibial fracture in 08/2018 that ultimately required IM placement who is an ongoing physical therapy who presents to the ED after sustaining mechanical fall.  Patient reports that she was at her boyfriend's house when she tripped on uneven flooring.  She reports landing on her right side intentionally given her history of left leg injury and weakness.  However, since then she has experienced worsening lower left leg edema and discomfort.  She is surprised by this as she landed on her right side.  She also is complaining of pain involving her right midshaft tibia/fib as well as right shoulder/clavicular discomfort.  She denies any head injury or LOC.  She is not blood thinners.  She states that this occurred yesterday morning and she has been ambulatory, albeit with antalgia.   HPI     Past Medical History:  Diagnosis Date  . Asthma   . Environmental allergies   . Prediabetes     Patient Active Problem List   Diagnosis Date Noted  . Closed left tibial fracture 09/17/2018    Past Surgical History:  Procedure Laterality Date  . radiation to thyroid  1999  . TIBIA IM NAIL INSERTION Left 09/18/2018   Procedure: INTRAMEDULLARY (IM) NAIL TIBIAL;  Surgeon: Yolonda Kidaogers, Jason Patrick, MD;  Location: [facility] OR;  Service: Orthopedics;  Laterality: Left;  . TONSILLECTOMY  1993     OB History   No obstetric history on file.     No family history on file.  Social History   Tobacco Use  . Smoking status: Never Smoker  . Smokeless tobacco: Never Used  Vaping Use  . Vaping Use: Never used  Substance Use Topics  . Alcohol use: Never  . Drug use: Never    Home Medications Prior to Admission medications   Medication  Sig Start Date End Date Taking? Authorizing Provider  aspirin 325 MG tablet Take 162.5 mg by mouth daily as needed for moderate pain.    [provider]  levothyroxine (SYNTHROID, LEVOTHROID) 150 MCG tablet Take 150 mcg by mouth daily. 09/13/18   [provider]  ondansetron (ZOFRAN ODT) 4 MG disintegrating tablet Take 1 tablet (4 mg total) by mouth every 8 (eight) hours as needed. 09/20/18   Yolonda Kidaogers, Jason Patrick, MD  traMADol (ULTRAM) 50 MG tablet Take 1-2 tablets (50-100 mg total) by mouth every 6 (six) hours as needed for moderate pain. 09/20/18   Yolonda Kidaogers, Jason Patrick, MD    Allergies    Codeine and Other  Review of Systems   Review of Systems  Constitutional: Negative for chills and fever.  Cardiovascular: Positive for leg swelling.  Musculoskeletal: Positive for arthralgias and gait problem.  Neurological: Negative for weakness and numbness.    Physical Exam Updated Vital Signs BP (!) 146/100 (BP Location: Right Arm)   Pulse 74   Temp 97.7 F (36.5 C) (Oral)   SpO2 100%   Physical Exam Vitals and nursing note reviewed. Exam conducted with a chaperone present.  Constitutional:      Appearance: Normal appearance.  HENT:     Head: Normocephalic and atraumatic.  Eyes:     General: No scleral icterus.    Conjunctiva/sclera: Conjunctivae normal.  Cardiovascular:     Rate and Rhythm: Normal rate and regular rhythm.     Pulses: Normal pulses.     Heart sounds: Normal heart sounds.  Pulmonary:     Effort: Pulmonary effort is normal. No respiratory distress.     Breath sounds: Normal breath sounds.  Musculoskeletal:     Comments: Left lower leg: Significant swelling and edema relative to right lower leg.  2+ pitting edema.  1+ pitting pedal edema.  No erythema or ecchymoses appreciated.  Pedal pulse and sensation intact throughout.  Patient can flex and dorsiflex against resistance.  Knee and hip ROM/strength intact. Right lower leg: No ecchymoses or other  overlying skin changes appreciated.  No tenderness over ankle or foot.  There is bony TTP over midshaft tibia as well as mild fibular head tenderness.  Dorsiflexion and flexion of ankle intact.  Pedal pulse intact.  Sensation intact throughout.  Knee and hip ROM/strength intact.  No bony TTP over knee or hip. Right shoulder: Abduction against resistance.  Overhead ROM intact.  No bony TTP.  No clavicular tenderness.  Negative empty can testing.  No overlying skin changes.  Skin:    General: Skin is dry.  Neurological:     Mental Status: She is alert.     GCS: GCS eye subscore is 4. GCS verbal subscore is 5. GCS motor subscore is 6.  Psychiatric:        Mood and Affect: Mood normal.        Behavior: Behavior normal.        Thought Content: Thought content normal.     ED Results / Procedures / Treatments   Labs (all labs ordered are listed, but only abnormal results are displayed) Labs Reviewed  POC URINE PREG, ED    EKG None  Radiology DG Tibia/Fibula Right  Result Date: 12/25/2019 CLINICAL DATA:  Status post fall. EXAM: RIGHT TIBIA AND FIBULA - 2 VIEW COMPARISON:  None. FINDINGS: There is no evidence of fracture or other focal bone lesions. Soft tissues are unremarkable. IMPRESSION: Negative. Electronically Signed   By: Aram Candela M.D.   On: 12/25/2019 15:52   VAS [place] LOWER EXTREMITY VENOUS (DVT) (ONLY [facility] & [facility] 7a-7p)  Result Date: 12/25/2019  Lower Venous DVTStudy Indications: Swelling, and history of left leg fracture w/ corrective surgery x approximately 1 year.  Comparison Study: No priro study Performing Technologist: Gertie Fey MHA, RDMS, RVT, RDCS  Examination Guidelines: A complete evaluation includes B-mode imaging, spectral Doppler, color Doppler, and power Doppler as needed of all accessible portions of each vessel. Bilateral testing is considered an integral part of a complete examination. Limited examinations for reoccurring indications may be performed as  noted. The reflux portion of the exam is performed with the patient in reverse Trendelenburg.  +-----+---------------+---------+-----------+----------+--------------+ RIGHTCompressibilityPhasicitySpontaneityPropertiesThrombus Aging +-----+---------------+---------+-----------+----------+--------------+ CFV  Full           Yes      Yes                                 +-----+---------------+---------+-----------+----------+--------------+   +---------+---------------+---------+-----------+----------+--------------+ LEFT     CompressibilityPhasicitySpontaneityPropertiesThrombus Aging +---------+---------------+---------+-----------+----------+--------------+ CFV      Full           Yes      Yes                                 +---------+---------------+---------+-----------+----------+--------------+  SFJ      Full                                                        +---------+---------------+---------+-----------+----------+--------------+ FV Prox  Full                                                        +---------+---------------+---------+-----------+----------+--------------+ FV Mid   Full                                                        +---------+---------------+---------+-----------+----------+--------------+ FV DistalFull                                                        +---------+---------------+---------+-----------+----------+--------------+ PFV      Full                                                        +---------+---------------+---------+-----------+----------+--------------+ POP      Full           Yes      Yes                                 +---------+---------------+---------+-----------+----------+--------------+ PTV      Full                                                        +---------+---------------+---------+-----------+----------+--------------+   Left Technical Findings: Not visualized  segments include peroneal veins.   Summary: RIGHT: - No evidence of common femoral vein obstruction.  LEFT: - There is no evidence of deep vein thrombosis in the lower extremity. However, portions of this examination were limited- see technologist comments above.  - No cystic structure found in the popliteal fossa.  *See table(s) above for measurements and observations.    Preliminary     Procedures Procedures (including critical care time)  Medications Ordered in ED Medications - No data to display  ED Course  I have reviewed the triage vital signs and the nursing notes.  Pertinent labs & imaging results that were available during my care of the patient were reviewed by me and considered in my medical decision making (see chart for details).    MDM Rules/Calculators/A&P                          Patient admits to chronic edema involving left  lower extremity, however states that it has been significantly worsened in the past 24 hours subsequent to her fall, despite landing on right side.  She is also endorsing worsening discomfort.  She is concerned for clot given atraumatic swelling.  Will obtain DVT study.  Her physical exam was largely unremarkable.  Her sensation and peripheral pulses intact.  Able to ambulate, albeit with antalgia.  ROM and strength of extremities intact.  She did have bony TTP over right tibia/fibular head, obtained plain films to assess for possible bony injury.  No bony TTP elsewhere.  Low suspicion for fracture at this time.  I personally reviewed plain films obtained of right tibia/fibula which were negative for fracture or other acute abnormalities.  I also spoke with vascular ultrasound team who informs me that she was negative for DVT in the DVT ultrasound study.  Patient is reasonable for follow-up outpatient with her orthopedist, Dr. Aundria Rud, for ongoing evaluation and management of her symptoms of improved conservative therapy.  Ibuprofen or Tylenol as needed for  symptoms of discomfort.  Patient follow-up with her primary care provider, as well.  She already has crutches at home and declines them here today.  All of the evaluation and work-up results were discussed with the patient and any family at bedside.  Patient and/or family were informed that while patient is appropriate for discharge at this time, some medical emergencies may only develop or become detectable after a period of time.  I specifically instructed patient and/or family to return to return to the ED or seek immediate medical attention for any new or worsening symptoms.  They were provided opportunity to ask any additional questions and have none at this time.  Prior to discharge patient is feeling well, agreeable with plan for discharge home.  They have expressed understanding of verbal discharge instructions as well as return precautions and are agreeable to the plan.    Final Clinical Impression(s) / ED Diagnoses Final diagnoses:  Fall, initial encounter    Rx / DC Orders ED Discharge Orders    None       Lorelee New, PA-C 12/25/19 1644    Bethann Berkshire, MD 12/26/19 1020

## 2019-12-25 NOTE — Discharge Instructions (Addendum)
Please follow-up with your primary care provider regarding today's encounter.  If your symptoms fail to improve with conservative therapy in 1 week, please follow-up with your orthopedist, Dr. Aundria Rud, for ongoing evaluation management.  I recommend that you take NSAIDs or Tylenol as needed for symptoms of discomfort.  Please use crutches, if necessary.  Weightbearing as tolerated.  Return to the ED or seek immediate medical attention to experience any new or worsening symptoms.

## 2019-12-26 ENCOUNTER — Encounter: Payer: Self-pay | Admitting: Physical Therapy

## 2020-04-25 IMAGING — DX LEFT TIBIA AND FIBULA - 2 VIEW
5 series · 5 of 5 positions shown · non-contrast
Comparison: None.

CLINICAL DATA: Fall today with left leg pain.

EXAM:
LEFT TIBIA AND FIBULA - 2 VIEW

[tibia ap (1 of 3)]
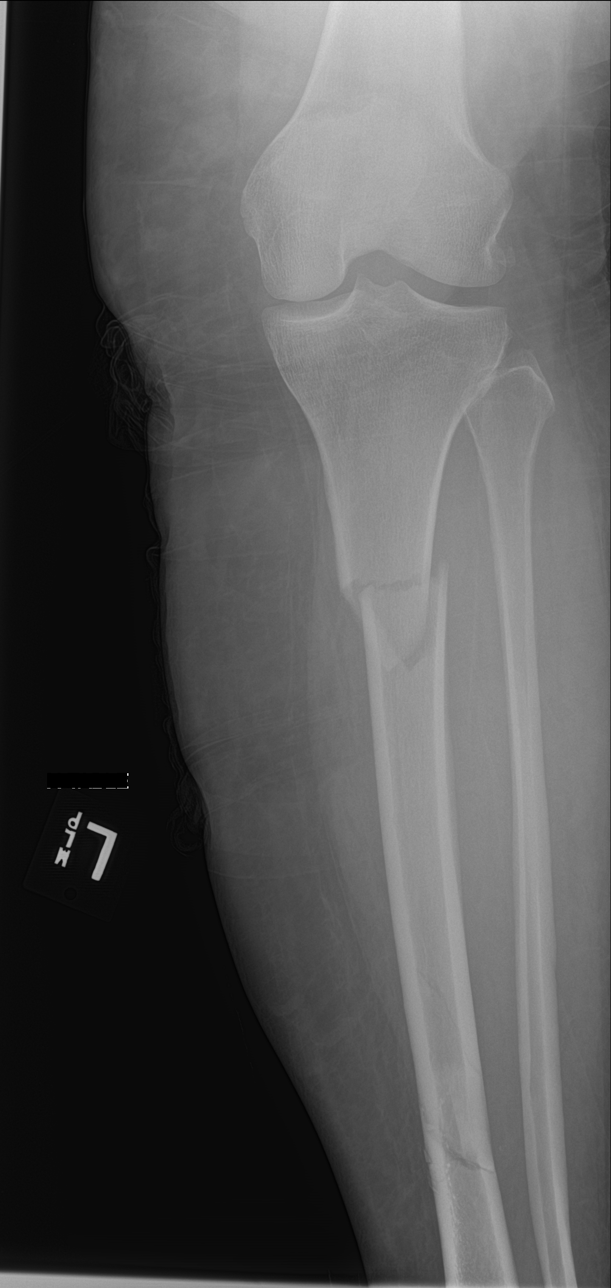

[tibia ap (2 of 3)]
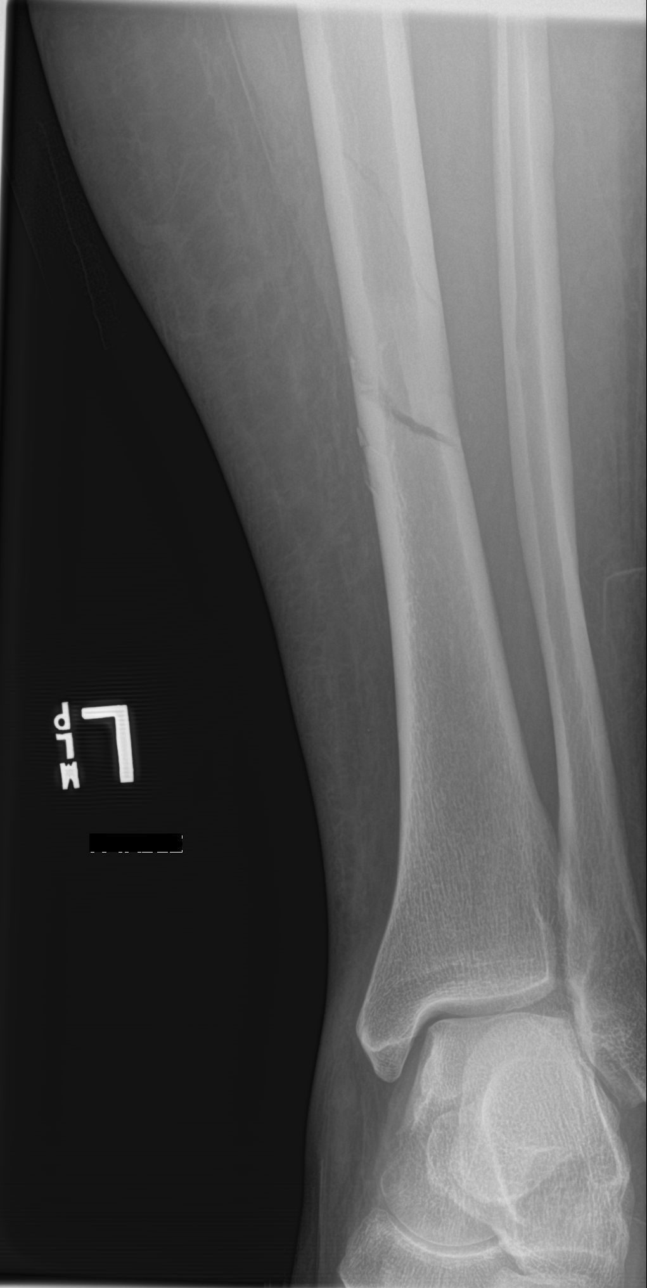

[tibia lat (1 of 2)]
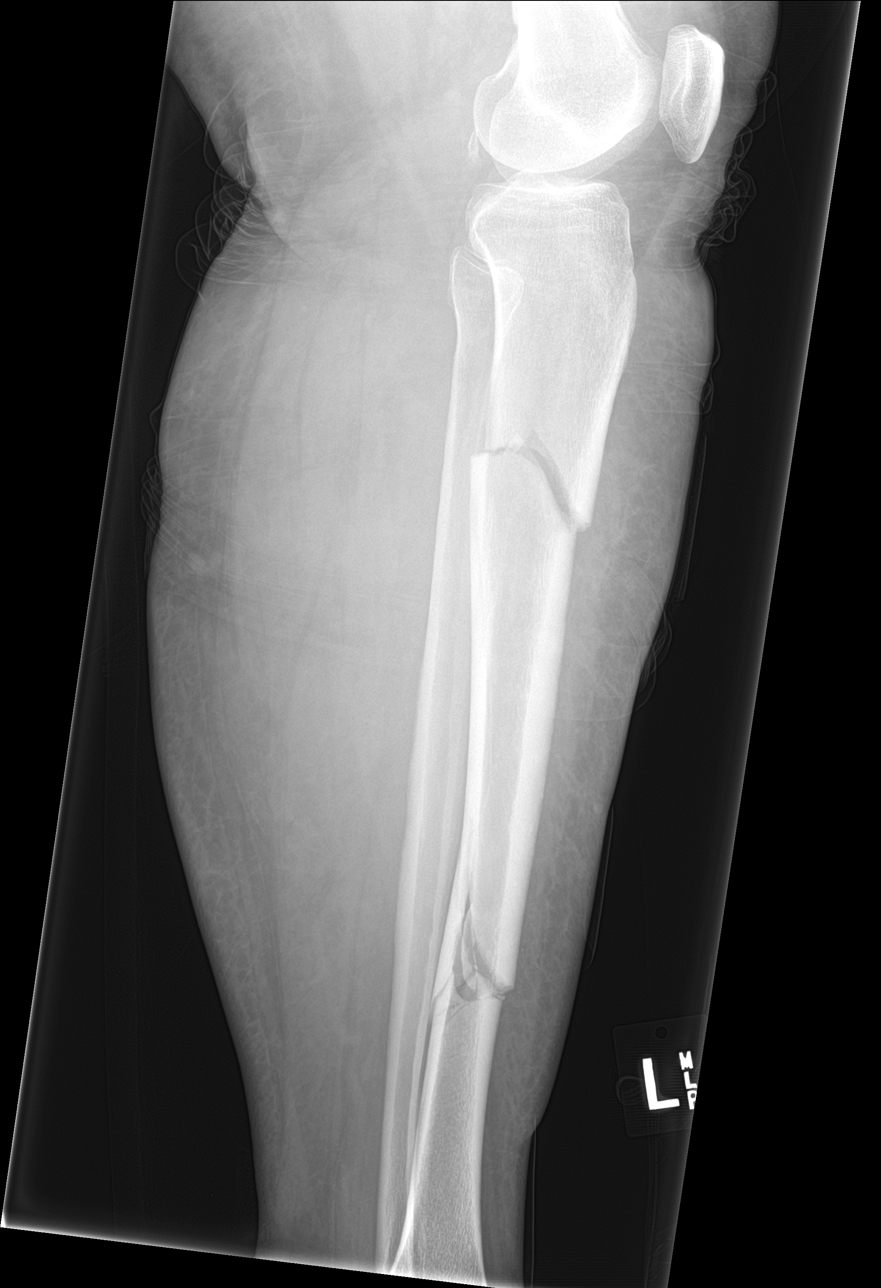

[tibia lat (2 of 2)]
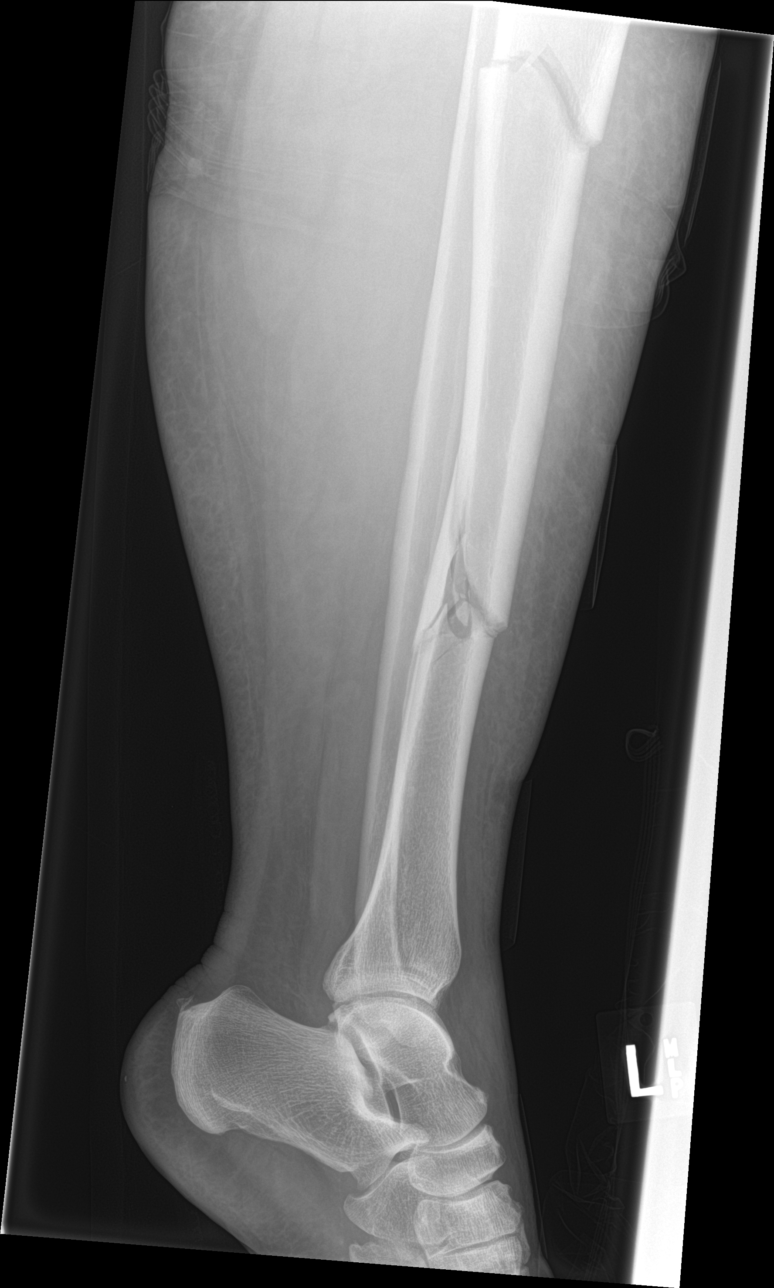

[tibia ap (3 of 3)]
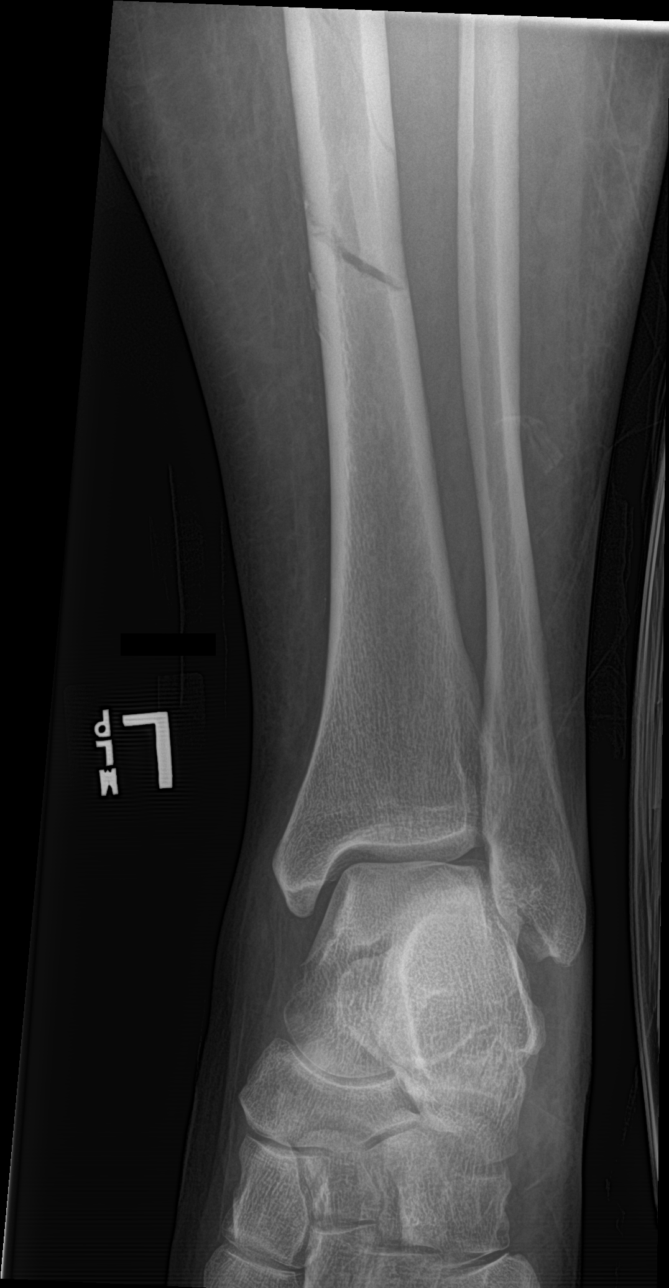

[5 of 5 positions shown; findings below may reference images not displayed]

FINDINGS: Exam demonstrates a mildly displaced slightly oblique fracture of
the proximal tibial diaphysis with second oblique slightly
comminuted fracture site over the mid to distal tibial diaphysis.
Remainder the exam is unremarkable.
IMPRESSION: Minimally displaced fractures over the proximal tibial diaphysis as
well as second fracture site with mild comminution over the mid to
distal tibial diaphysis.

## 2020-11-13 ENCOUNTER — Other Ambulatory Visit: Payer: Self-pay

## 2020-11-13 ENCOUNTER — Emergency Department (HOSPITAL_COMMUNITY)
Admission: EM | Admit: 2020-11-13 | Discharge: 2020-11-13 | Disposition: A | Discharge status: Home / Self Care | Payer: Self-pay | Attending: Emergency Medicine | Admitting: Emergency Medicine

## 2020-11-13 DIAGNOSIS — Z760 Encounter for issue of repeat prescription: Secondary | ICD-10-CM

## 2020-11-13 DIAGNOSIS — J45909 Unspecified asthma, uncomplicated: Secondary | ICD-10-CM | POA: Insufficient documentation

## 2020-11-13 MED ORDER — LEVOTHYROXINE SODIUM 150 MCG PO TABS: 150.0000 ug | ORAL_TABLET | Freq: Every day | ORAL | 6 refills | Status: DC

## 2020-11-13 NOTE — ED Provider Notes (Signed)
[facility]-EMERGENCY DEPT Provider Note   CSN: [id number] Arrival date & time: 11/13/20  1441     History No chief complaint on file.   Shelby Yang is a 45 y.o. female.  Patient is a 45 year old female with a history of hypothyroidism after having thyroidectomy, asthma and prediabetes who is presenting today for medication refill.  Patient reports that she ran out of her Synthroid 3 days ago.  She has been on this medication for years after her thyroid was removed.  She did have a PCP but during COVID he retired and never went back.  She was able to return to the practice in December where they had a traveling doctor there who gave her 60-month refill.  However when she attempted to go back to the practice the doctor was gone.  She is scheduled to go to [facility] in [place] but they are 5 months out.  When she talked with them on the phone today they recommended she come to the emergency room to get her medication.  She has no complaints at this time.  She also does not really want to go to [facility] because it is quite a distance from her home.  The history is provided by the patient.       Past Medical History:  Diagnosis Date  . Asthma   . Environmental allergies   . Prediabetes     Patient Active Problem List   Diagnosis Date Noted  . Closed left tibial fracture 09/17/2018    Past Surgical History:  Procedure Laterality Date  . radiation to thyroid  1999  . TIBIA IM NAIL INSERTION Left 09/18/2018   Procedure: INTRAMEDULLARY (IM) NAIL TIBIAL;  Surgeon: Yolonda Kida, MD;  Location: [facility] OR;  Service: Orthopedics;  Laterality: Left;  . TONSILLECTOMY  1993     OB History   No obstetric history on file.     No family history on file.  Social History   Tobacco Use  . Smoking status: Never Smoker  . Smokeless tobacco: Never Used  Vaping Use  . Vaping Use: Never used  Substance Use Topics  . Alcohol use:  Never  . Drug use: Never    Home Medications Prior to Admission medications   Medication Sig Start Date End Date Taking? Authorizing Provider  aspirin 325 MG tablet Take 162.5 mg by mouth daily as needed for moderate pain.    [provider]  levothyroxine (SYNTHROID) 150 MCG tablet Take 1 tablet (150 mcg total) by mouth daily. 11/13/20   Gwyneth Sprout, MD  ondansetron (ZOFRAN ODT) 4 MG disintegrating tablet Take 1 tablet (4 mg total) by mouth every 8 (eight) hours as needed. 09/20/18   Yolonda Kida, MD  traMADol (ULTRAM) 50 MG tablet Take 1-2 tablets (50-100 mg total) by mouth every 6 (six) hours as needed for moderate pain. 09/20/18   Yolonda Kida, MD    Allergies    Codeine and Other  Review of Systems   Review of Systems  All other systems reviewed and are negative.   Physical Exam Updated Vital Signs BP (!) 179/90 (BP Location: Right Arm)   Pulse 66   Temp 98.2 F (36.8 C) (Oral)   Resp 16   Ht 5\' 4"  (1.626 m)   Wt 101.6 kg   SpO2 100%   BMI 38.45 kg/m   Physical Exam Vitals and nursing note reviewed.  Constitutional:      General: She is not  in acute distress.    Appearance: Normal appearance. She is well-developed.  HENT:     Head: Normocephalic and atraumatic.  Eyes:     Pupils: Pupils are equal, round, and reactive to light.  Cardiovascular:     Rate and Rhythm: Normal rate.     Pulses: Normal pulses.  Pulmonary:     Effort: Pulmonary effort is normal. No respiratory distress.  Musculoskeletal:     Comments: No edema  Skin:    General: Skin is warm and dry.     Findings: No rash.  Neurological:     Mental Status: She is alert and oriented to person, place, and time. Mental status is at baseline.     Cranial Nerves: No cranial nerve deficit.  Psychiatric:        Mood and Affect: Mood normal.        Behavior: Behavior normal.     ED Results / Procedures / Treatments   Labs (all labs ordered are listed, but only abnormal  results are displayed) Labs Reviewed - No data to display  EKG None  Radiology No results found.  Procedures Procedures   Medications Ordered in ED Medications - No data to display  ED Course  I have reviewed the triage vital signs and the nursing notes.  Pertinent labs & imaging results that were available during my care of the patient were reviewed by me and considered in my medical decision making (see chart for details).    MDM Rules/Calculators/A&P                          Patient presenting today with a request of medication refill.  No other issues at this time.  Patient is well-appearing.  Will discharge home.  Patient is hypertensive here and discussed with her following up with PCP and they can follow-up with this.  No prior history of hypertension. Final Clinical Impression(s) / ED Diagnoses Final diagnoses:  Encounter for medication refill    Rx / DC Orders ED Discharge Orders         Ordered    levothyroxine (SYNTHROID) 150 MCG tablet  Daily        11/13/20 1509           Gwyneth Sprout, MD 11/13/20 1513

## 2021-11-02 ENCOUNTER — Emergency Department (HOSPITAL_COMMUNITY)
Admission: EM | Admit: 2021-11-02 | Discharge: 2021-11-03 | Disposition: A | Discharge status: Home / Self Care | Payer: Self-pay | Attending: Emergency Medicine | Admitting: Emergency Medicine

## 2021-11-02 ENCOUNTER — Encounter (HOSPITAL_COMMUNITY): Payer: Self-pay

## 2021-11-02 ENCOUNTER — Emergency Department (HOSPITAL_COMMUNITY): Payer: Self-pay

## 2021-11-02 ENCOUNTER — Other Ambulatory Visit: Payer: Self-pay

## 2021-11-02 DIAGNOSIS — J45909 Unspecified asthma, uncomplicated: Secondary | ICD-10-CM | POA: Insufficient documentation

## 2021-11-02 DIAGNOSIS — S93402A Sprain of unspecified ligament of left ankle, initial encounter: Secondary | ICD-10-CM | POA: Insufficient documentation

## 2021-11-02 DIAGNOSIS — X501XXA Overexertion from prolonged static or awkward postures, initial encounter: Secondary | ICD-10-CM | POA: Insufficient documentation

## 2021-11-02 DIAGNOSIS — Z7982 Long term (current) use of aspirin: Secondary | ICD-10-CM | POA: Insufficient documentation

## 2021-11-02 NOTE — ED Triage Notes (Signed)
Pt presents to ED from home with c/o of left leg swelling and pain. States she stepped off of a curb wrong around 7 pm tonight.  ?

## 2021-11-03 NOTE — ED Provider Notes (Signed)
?[facility]-EMERGENCY DEPT ?Provider Note ? ? ?CSN: [id number] ?Arrival date & time: 11/02/21  2300 ? ?  ? ?History ? ?Chief Complaint  ?Patient presents with  ? Leg Swelling  ? ? ?Katheren Jimmerson is a 46 y.o. female. ? ?HPI ? ?  ? ?This is a 46 year old female with prior history of significant tibial fracture requiring rod placement who presents with left leg injury.  Patient reports that she stepped off a curb incorrectly earlier this evening.  She noted increased swelling.  She denies significant pain.  She states that her ankle turned underneath her.  She has been ambulatory.  She is concerned given her prior injury.  Denies numbness or tingling of the foot. ? ?Home Medications ?Prior to Admission medications   ?Medication Sig Start Date End Date Taking? Authorizing Provider  ?aspirin 325 MG tablet Take 162.5 mg by mouth daily as needed for moderate pain.    [provider]  ?levothyroxine (SYNTHROID) 150 MCG tablet Take 1 tablet (150 mcg total) by mouth daily. 11/13/20   Gwyneth Sprout, MD  ?ondansetron (ZOFRAN ODT) 4 MG disintegrating tablet Take 1 tablet (4 mg total) by mouth every 8 (eight) hours as needed. 09/20/18   Yolonda Kida, MD  ?traMADol Janean Sark) 50 MG tablet Take 1-2 tablets (50-100 mg total) by mouth every 6 (six) hours as needed for moderate pain. 09/20/18   Yolonda Kida, MD  ?   ? ?Allergies    ?Codeine and Other   ? ?Review of Systems   ?Review of Systems  ?Musculoskeletal:   ?     Ankle pain and swelling  ?All other systems reviewed and are negative. ? ?Physical Exam ?Updated Vital Signs ?BP (!) 171/83 (BP Location: Left Arm)   Pulse 70   Temp 98.1 ?F (36.7 ?C) (Oral)   Resp 20   SpO2 100%  ?Physical Exam ?Vitals and nursing note reviewed.  ?Constitutional:   ?   Appearance: She is well-developed. She is obese. She is not ill-appearing.  ?HENT:  ?   Head: Normocephalic and atraumatic.  ?   Mouth/Throat:  ?   Mouth: Mucous membranes are moist.   ?Eyes:  ?   Pupils: Pupils are equal, round, and reactive to light.  ?Cardiovascular:  ?   Rate and Rhythm: Normal rate and regular rhythm.  ?Pulmonary:  ?   Effort: Pulmonary effort is normal. No respiratory distress.  ?Abdominal:  ?   Palpations: Abdomen is soft.  ?Musculoskeletal:  ?   Cervical back: Neck supple.  ?   Comments: Focused examination of the left leg with slight swelling noted over the left ankle, there is tenderness to palpation over the medial malleolus, no obvious deformities, 2+ DP pulse, neurovascular intact  ?Skin: ?   General: Skin is warm and dry.  ?Neurological:  ?   Mental Status: She is alert and oriented to person, place, and time.  ?Psychiatric:     ?   Mood and Affect: Mood normal.  ? ? ?ED Results / Procedures / Treatments   ?Labs ?(all labs ordered are listed, but only abnormal results are displayed) ?Labs Reviewed - No data to display ? ?EKG ?None ? ?Radiology ?DG Ankle Complete Left ? ?Result Date: 11/03/2021 ?CLINICAL DATA:  Recent fall with left leg pain and swelling, initial encounter EXAM: LEFT ANKLE COMPLETE - 3+ VIEW COMPARISON:  03/24/2019 FINDINGS: Postsurgical changes in the distal tibia are noted with healed fracture. Generalized soft tissue swelling is noted about  the ankle joint. No acute fracture or dislocation is noted. Calcaneal spurring and tarsal degenerative changes are seen. IMPRESSION: Generalized soft tissue swelling without acute bony abnormality. Electronically Signed   By: Alcide Clever M.D.   On: 11/03/2021 00:10   ? ?Procedures ?Procedures  ? ? ?Medications Ordered in ED ?Medications - No data to display ? ?ED Course/ Medical Decision Making/ A&P ?  ?                        ?Medical Decision Making ?Amount and/or Complexity of Data Reviewed ?Radiology: ordered. ? ? ?This patient presents to the ED for concern of ankle pain, this involves an extensive number of treatment options, and is a complaint that carries with it a high risk of complications and  morbidity.  I considered the following differential and admission for this acute, potentially life threatening condition.  The differential diagnosis includes fracture, sprain, contusion ? ?MDM:   ? ?This is a 46 year old female who presents with left ankle injury.  Prior significant injury to the left tibia.  She has swelling and some medial malleolus tenderness.  She is otherwise nontoxic.  X-rays obtained and independently reviewed by myself.  No obvious fracture.  Suspect sprain given mechanism of injury and clinical exam.  Recommend ice, elevation, and NSAIDs. ? ?(Labs, imaging, consults) ? ?Labs: ?I Ordered, and personally interpreted labs.  The pertinent results include: None ? ?Imaging Studies ordered: ?I ordered imaging studies including x-ray ?I independently visualized and interpreted imaging. ?I agree with the radiologist interpretation ? ?Additional history obtained from review.  External records from outside source obtained and reviewed including prior injuries and OR ? ?Cardiac Monitoring: ?The patient was maintained on a cardiac monitor.  I personally viewed and interpreted the cardiac monitored which showed an underlying rhythm of: Sinus rhythm ? ?Reevaluation: ?After the interventions noted above, I reevaluated the patient and found that they have :stayed the same ? ?Social Determinants of Health: ?Lives independently ? ?Disposition: Discharge ? ?Co morbidities that complicate the patient evaluation ? ?Past Medical History:  ?Diagnosis Date  ? Asthma   ? Environmental allergies   ? Prediabetes   ?  ? ?Medicines ?No orders of the defined types were placed in this encounter. ?  ?I have reviewed the patients home medicines and have made adjustments as needed ? ?Problem List / ED Course: ?Problem List Items Addressed This Visit   ?None ?Visit Diagnoses   ? ? Sprain of left ankle, unspecified ligament, initial encounter    -  Primary  ? ?  ?  ? ? ? ? ? ? ? ? ? ? ? ? ?Final Clinical Impression(s) / ED  Diagnoses ?Final diagnoses:  ?Sprain of left ankle, unspecified ligament, initial encounter  ? ? ?Rx / DC Orders ?ED Discharge Orders   ? ? None  ? ?  ? ? ?  ?Shon Baton, MD ?11/03/21 0020 ? ?

## 2021-11-03 NOTE — Discharge Instructions (Signed)
You were seen today for left ankle pain.  Keep iced and elevated.  Your x-rays do not show a fracture.  You likely have a sprain.  Ambulate as tolerated.  Take ibuprofen as needed for any pain. ?

## 2021-12-10 ENCOUNTER — Other Ambulatory Visit: Payer: Self-pay

## 2021-12-10 ENCOUNTER — Emergency Department (HOSPITAL_COMMUNITY)
Admission: EM | Admit: 2021-12-10 | Discharge: 2021-12-10 | Disposition: A | Discharge status: Home / Self Care | Payer: Self-pay | Attending: Emergency Medicine | Admitting: Emergency Medicine

## 2021-12-10 ENCOUNTER — Encounter (HOSPITAL_COMMUNITY): Payer: Self-pay

## 2021-12-10 ENCOUNTER — Emergency Department (HOSPITAL_COMMUNITY): Payer: Self-pay

## 2021-12-10 DIAGNOSIS — S61213A Laceration without foreign body of left middle finger without damage to nail, initial encounter: Secondary | ICD-10-CM | POA: Insufficient documentation

## 2021-12-10 DIAGNOSIS — W260XXA Contact with knife, initial encounter: Secondary | ICD-10-CM | POA: Insufficient documentation

## 2021-12-10 MED ORDER — BACITRACIN ZINC 500 UNIT/GM EX OINT
TOPICAL_OINTMENT | Freq: Two times a day (BID) | CUTANEOUS | Status: DC
  Filled 2021-12-10: qty 0.9

## 2021-12-10 NOTE — ED Triage Notes (Signed)
Pt presents to ED from home with complaint of laceration to left middle finger. Pt states she cut her finer with a knife at work tonight, states it bled for hours. Bleeding controlled at this time.

## 2021-12-10 NOTE — ED Provider Notes (Signed)
[facility]-EMERGENCY DEPT Provider Note   CSN: [id number] Arrival date & time: 12/10/21  2046     History  Chief Complaint  Patient presents with   Finger Injury    Shelby Yang is a 46 y.o. female. Resents the ED with a laceration to her left middle finger.  She said she works at [employer] and she was using a knife and cut her finger.  She says that it bled for hours afterward but it finally stopped.  She did have a tetanus shot last year.  HPI     Home Medications Prior to Admission medications   Medication Sig Start Date End Date Taking? Authorizing Provider  aspirin 325 MG tablet Take 162.5 mg by mouth daily as needed for moderate pain.    [provider]  levothyroxine (SYNTHROID) 150 MCG tablet Take 1 tablet (150 mcg total) by mouth daily. 11/13/20   Gwyneth Sprout, MD  ondansetron (ZOFRAN ODT) 4 MG disintegrating tablet Take 1 tablet (4 mg total) by mouth every 8 (eight) hours as needed. 09/20/18   Yolonda Kida, MD  traMADol (ULTRAM) 50 MG tablet Take 1-2 tablets (50-100 mg total) by mouth every 6 (six) hours as needed for moderate pain. 09/20/18   Yolonda Kida, MD      Allergies    Codeine and Other    Review of Systems   Review of Systems  Skin:  Positive for wound.  All other systems reviewed and are negative.   Physical Exam Updated Vital Signs BP (!) 172/98 (BP Location: Left Arm)   Pulse 68   Temp 98 F (36.7 C) (Oral)   Resp 18   Ht 5\' 4"  (1.626 m)   Wt 101.6 kg   SpO2 96%   BMI 38.45 kg/m  Physical Exam Vitals and nursing note reviewed.  Constitutional:      General: She is not in acute distress.    Appearance: Normal appearance. She is well-developed. She is not ill-appearing, toxic-appearing or diaphoretic.  HENT:     Head: Normocephalic and atraumatic.     Nose: No nasal deformity.     Mouth/Throat:     Lips: Pink. No lesions.  Eyes:     General: Gaze aligned appropriately. No scleral  icterus.       Right eye: No discharge.        Left eye: No discharge.     Conjunctiva/sclera: Conjunctivae normal.     Right eye: Right conjunctiva is not injected. No exudate or hemorrhage.    Left eye: Left conjunctiva is not injected. No exudate or hemorrhage. Pulmonary:     Effort: Pulmonary effort is normal. No respiratory distress.  Skin:    General: Skin is warm and dry.     Comments: There is a small less than half a centimeter linear cut on the left middle finger overlying the PCP joint.  There is not appear to be any tendon involvement.  Bleeding seems to be under control.  She is neurovascularly intact with normal capillary refill and sensation.  Neurological:     Mental Status: She is alert and oriented to person, place, and time.  Psychiatric:        Mood and Affect: Mood normal.        Speech: Speech normal.        Behavior: Behavior normal. Behavior is cooperative.     ED Results / Procedures / Treatments   Labs (all labs ordered are listed, but  only abnormal results are displayed) Labs Reviewed - No data to display  EKG None  Radiology DG Hand Complete Left  Result Date: 12/10/2021 CLINICAL DATA:  Laceration. EXAM: LEFT HAND - COMPLETE 3+ VIEW COMPARISON:  None Available. FINDINGS: There is no evidence of fracture or dislocation. There is no evidence of arthropathy or other focal bone abnormality. Soft tissues are unremarkable. IMPRESSION: Negative. Electronically Signed   By: Darliss Cheney M.D.   On: 12/10/2021 21:16    Procedures Procedures   Medications Ordered in ED Medications  bacitracin ointment (has no administration in time range)    ED Course/ Medical Decision Making/ A&P                           Medical Decision Making Amount and/or Complexity of Data Reviewed Radiology: ordered.  Risk OTC drugs.   Patient presents after cutting her left middle finger with a knife.  Is a very small laceration that is well approximated overlying the MCP  joint of her left middle finger.  There does not appear to be any tendon or bone involvement.  Laceration appears clean.  Bleeding is controlled.  She is neurovascularly intact.  We did obtain an x-ray which did not reveal any acute fracture or foreign body.  The lesion is really small enough that I do not think that we need to put a suture in as long as she is able to keep her finger straight.  We can clean this, put a dressing on, and put her in a finger splint to help prevent movement.  I considered putting her on prophylactic antibiotics, but the cut is so superficial that I do not think this is necessary.  She is given return precautions for signs of infection.   Final Clinical Impression(s) / ED Diagnoses Final diagnoses:  Laceration of left middle finger without foreign body without damage to nail, initial encounter    Rx / DC Orders ED Discharge Orders     None         Claudie Leach, PA-C 12/10/21 2156    Bethann Berkshire, MD 12/13/21 1723

## 2021-12-18 ENCOUNTER — Emergency Department
Admission: EM | Admit: 2021-12-18 | Discharge: 2021-12-18 | Disposition: A | Discharge status: Home / Self Care | Payer: Self-pay | Attending: Emergency Medicine | Admitting: Emergency Medicine

## 2021-12-18 ENCOUNTER — Encounter: Payer: Self-pay | Admitting: Emergency Medicine

## 2021-12-18 ENCOUNTER — Other Ambulatory Visit: Payer: Self-pay

## 2021-12-18 DIAGNOSIS — Z760 Encounter for issue of repeat prescription: Secondary | ICD-10-CM | POA: Insufficient documentation

## 2021-12-18 DIAGNOSIS — J45909 Unspecified asthma, uncomplicated: Secondary | ICD-10-CM | POA: Insufficient documentation

## 2021-12-18 DIAGNOSIS — E039 Hypothyroidism, unspecified: Secondary | ICD-10-CM | POA: Insufficient documentation

## 2021-12-18 MED ORDER — LEVOTHYROXINE SODIUM 150 MCG PO TABS: 150.0000 ug | ORAL_TABLET | Freq: Every day | ORAL | 0 refills | Status: DC

## 2021-12-18 NOTE — Discharge Instructions (Addendum)
-  Take your levothyroxine as prescribed.  -Please follow-up with your primary care provider as scheduled.  -Return to the emergency department anytime if you begin to experience any new or worsening symptoms.

## 2021-12-18 NOTE — ED Notes (Signed)
Pt in bed, pt states that she is ready to go home. Verbalized understanding d/c and follow up.  Ambulatory from dpt

## 2021-12-18 NOTE — ED Triage Notes (Signed)
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 Pt reports takes synthroid 150

## 2021-12-18 NOTE — ED Provider Notes (Signed)
[facility] Provider Note    Event Date/Time   First MD Initiated Contact with Patient 12/18/21 1625     (approximate)   History   Chief Complaint Medication Refill   HPI Shelby Yang is a 46 y.o. 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  She states that her primary care provider has retired and she has been unable to get any more of her levothyroxine.  She states that she is established with another provider, but is unable to get to see them for another 45 days.  They advised her to go to the emergency department for a refill.  She states that she tolerates this medication well and has been on it since 1999.  Denies any active symptoms at this time.  Reports running out of her medication yesterday.  History Limitations: No limitations.        Physical Exam  Triage Vital Signs: ED Triage Vitals  Enc Vitals Group     BP 12/18/21 1554 (!) 153/88     Pulse Rate 12/18/21 1554 69     Resp 12/18/21 1554 18     Temp 12/18/21 1554 98 F (36.7 C)     Temp Source 12/18/21 1554 Oral     SpO2 12/18/21 1554 97 %     Weight 12/18/21 1534 223 lb 15.8 oz (101.6 kg)     Height 12/18/21 1534 5\' 4"  (1.626 m)     Head Circumference --      Peak Flow --      Pain Score 12/18/21 1534 0     Pain Loc --      Pain Edu? --      Excl. in GC? --     Most recent vital signs: Vitals:   12/18/21 1554  BP: (!) 153/88  Pulse: 69  Resp: 18  Temp: 98 F (36.7 C)  SpO2: 97%    General: Awake, NAD.  Skin: Warm, dry. No rashes or lesions.  Eyes: PERRL. Conjunctivae normal.  CV: Good peripheral perfusion.  Resp: Normal effort.  Abd: Soft, non-tender. No distention.  Neuro: At baseline. No gross neurological deficits.   Focused Exam: Not applicable.  Physical Exam    ED Results / Procedures / Treatments  Labs (all labs ordered are listed, but only  abnormal results are displayed) Labs Reviewed - No data to display   EKG None   RADIOLOGY  ED Provider Interpretation: None.  No results found.  PROCEDURES:  Critical Care performed: None.  Procedures    MEDICATIONS ORDERED IN ED: Medications - No data to display   IMPRESSION / MDM / ASSESSMENT AND PLAN / ED COURSE  I reviewed the triage vital signs and the nursing notes.                              Differential diagnosis includes, but is not limited to, hypothyroidism, encounter for medication refill  ED Course Patient appears well, vitals within normal limits for the patient, NAD.  Assessment/Plan Patient presents for medication refill for her hypothyroidism.  Reports running out yesterday.  Per chart review, she takes 150 mcg levothyroxine daily.  We will provide her with a 18-month supply.  No further work-up or treatment indicated at this time.  We will plan to discharge.  Patient's presentation is most consistent with acute, uncomplicated illness.   Provided the  patient with anticipatory guidance, return precautions, and educational material. Encouraged the patient to return to the emergency department at any time if they begin to experience any new or worsening symptoms. Patient expressed understanding and agreed with the plan.       FINAL CLINICAL IMPRESSION(S) / ED DIAGNOSES   Final diagnoses:  Medication refill     Rx / DC Orders   ED Discharge Orders          Ordered    levothyroxine (SYNTHROID) 150 MCG tablet  Daily,   Status:  Discontinued        12/18/21 1633    levothyroxine (SYNTHROID) 150 MCG tablet  Daily        12/18/21 1638             Note:  This document was prepared using Dragon voice recognition software and may include unintentional dictation errors.   Varney Daily, [place] 12/18/21 1639    Shaune Pollack, MD 12/22/21 1100

## 2022-01-01 ENCOUNTER — Emergency Department (HOSPITAL_COMMUNITY): Payer: Self-pay

## 2022-01-01 ENCOUNTER — Emergency Department (HOSPITAL_COMMUNITY)
Admission: EM | Admit: 2022-01-01 | Discharge: 2022-01-01 | Disposition: A | Discharge status: Home / Self Care | Payer: Self-pay | Attending: Emergency Medicine | Admitting: Emergency Medicine

## 2022-01-01 ENCOUNTER — Encounter (HOSPITAL_COMMUNITY): Payer: Self-pay

## 2022-01-01 DIAGNOSIS — J45909 Unspecified asthma, uncomplicated: Secondary | ICD-10-CM | POA: Insufficient documentation

## 2022-01-01 DIAGNOSIS — Z7982 Long term (current) use of aspirin: Secondary | ICD-10-CM | POA: Insufficient documentation

## 2022-01-01 DIAGNOSIS — M79605 Pain in left leg: Secondary | ICD-10-CM

## 2022-01-01 DIAGNOSIS — M79662 Pain in left lower leg: Secondary | ICD-10-CM | POA: Insufficient documentation

## 2022-01-01 NOTE — Discharge Instructions (Addendum)
Return to the ED with any new or worsening signs or symptoms Please follow-up with your PCP for ongoing needs Please see attached work note Please utilize RICE method.  Rest, Ice, compression, elevation Please continue taking NSAIDs/Tylenol for pain relief at home as well as icing your left ankle

## 2022-01-01 NOTE — ED Notes (Signed)
Patient taken to radiology at this time ?

## 2022-01-01 NOTE — ED Provider Notes (Signed)
[facility] DEPT Provider Note   CSN: [id number] Arrival date & time: 01/01/22  2019     History  Chief Complaint  Patient presents with   Shelby Yang is a 46 y.o. female with medical history of prediabetes, asthma, tibia intramedullary nail insertion in 2020.  The patient presents to the ED for evaluation of left-sided leg pain.  Patient reports that prior to arrival she was walking down her driveway which has recently been "cleared" to allow new gravel to be laid.  The patient states that her driveway is a "mud pit" currently and it caused her to trip onto her left leg/knee.  The patient states that she did not hit her head, lose consciousness.  Patient reports immediate pain in her left leg, left ankle at this time.  The patient is concerned because she has a history of intramedullary nail insertion in her left tibia in 2020.  The patient is endorsing numbness to the bottom of her foot on the left side.  Patient reports that the numbness has been progressively worsening over the last 5 months however today after she felt the numbness increased.   Fall       Home Medications Prior to Admission medications   Medication Sig Start Date End Date Taking? Authorizing Provider  aspirin 325 MG tablet Take 162.5 mg by mouth daily as needed for moderate pain.    [provider]  levothyroxine (SYNTHROID) 150 MCG tablet Take 1 tablet (150 mcg total) by mouth daily. 12/18/21   Teodoro Spray, PA  ondansetron (ZOFRAN ODT) 4 MG disintegrating tablet Take 1 tablet (4 mg total) by mouth every 8 (eight) hours as needed. 09/20/18   Nicholes Stairs, MD  traMADol (ULTRAM) 50 MG tablet Take 1-2 tablets (50-100 mg total) by mouth every 6 (six) hours as needed for moderate pain. 09/20/18   Nicholes Stairs, MD      Allergies    Codeine and Other    Review of Systems   Review of Systems  Musculoskeletal:  Positive for myalgias.   Neurological:  Positive for numbness. Negative for syncope.  All other systems reviewed and are negative.   Physical Exam Updated Vital Signs BP (!) 174/91   Pulse 65   Temp 98.1 F (36.7 C) (Oral)   Resp 17   Ht 5\' 4"  (1.626 m)   Wt 108.9 kg   SpO2 98%   BMI 41.20 kg/m  Physical Exam Vitals and nursing note reviewed.  Constitutional:      General: She is not in acute distress.    Appearance: Normal appearance. She is not ill-appearing, toxic-appearing or diaphoretic.  HENT:     Head: Normocephalic and atraumatic.     Nose: Nose normal. No congestion.     Mouth/Throat:     Mouth: Mucous membranes are moist.     Pharynx: Oropharynx is clear.  Eyes:     Extraocular Movements: Extraocular movements intact.     Conjunctiva/sclera: Conjunctivae normal.     Pupils: Pupils are equal, round, and reactive to light.  Cardiovascular:     Rate and Rhythm: Normal rate and regular rhythm.  Pulmonary:     Effort: Pulmonary effort is normal.     Breath sounds: Normal breath sounds. No wheezing.  Abdominal:     General: Abdomen is flat. Bowel sounds are normal.     Palpations: Abdomen is soft.     Tenderness: There is no abdominal tenderness.  Musculoskeletal:     Cervical back: Normal range of motion and neck supple. No tenderness.     Right knee: Normal.     Left knee: No swelling or deformity. Normal range of motion. No tenderness.     Left lower leg: No swelling, deformity or tenderness. No edema.     Left ankle: Swelling present. No deformity, ecchymosis or lacerations. Tenderness present. Normal range of motion.     Comments: Patient left knee has no signs of deformity, swelling.  The patient has full range of motion of the left knee.  The patient left ankle does have soft tissue swelling however she does have intact range of motion actively.  There is diffuse tenderness, nonfocal.  Skin:    General: Skin is warm and dry.     Capillary Refill: Capillary refill takes less than  2 seconds.  Neurological:     Mental Status: She is alert and oriented to person, place, and time.     ED Results / Procedures / Treatments   Labs (all labs ordered are listed, but only abnormal results are displayed) Labs Reviewed - No data to display  EKG None  Radiology DG Knee Complete 4 Views Left  Result Date: 01/01/2022 CLINICAL DATA:  Pain after fall EXAM: LEFT KNEE - COMPLETE 4+ VIEW COMPARISON:  None Available. FINDINGS: No evidence of fracture, dislocation, or joint effusion. No evidence of arthropathy or other focal bone abnormality. Soft tissues are unremarkable. IMPRESSION: Negative. Electronically Signed   By: Gerome Sam III M.D.   On: 01/01/2022 21:18   DG Tibia/Fibula Left  Result Date: 01/01/2022 CLINICAL DATA:  Pain after fall EXAM: LEFT TIBIA AND FIBULA - 2 VIEW COMPARISON:  None Available. FINDINGS: There is a rod in the tibia with proximal and distal interlocking screws crossing 2 healed tibial fractures. The distal femur and patella are intact. The fibula is normal. Tibial hardware is in good position. Healed fractures associated with the tibia. No acute fractures identified. IMPRESSION: No acute fractures identified. Electronically Signed   By: Gerome Sam III M.D.   On: 01/01/2022 21:15   DG Ankle Complete Left  Result Date: 01/01/2022 CLINICAL DATA:  Pain after fall. EXAM: LEFT ANKLE COMPLETE - 3+ VIEW COMPARISON:  None Available. FINDINGS: There is a rod in the tibia with 2 distal interlocking screws crossing a tibial diaphysis fracture which is healed. Diffuse soft tissue swelling. No fracture or dislocation. No acute abnormalities are identified. IMPRESSION: Diffuse soft tissue swelling.  No acute fracture identified. Electronically Signed   By: Gerome Sam III M.D.   On: 01/01/2022 21:14    Procedures Procedures   Medications Ordered in ED Medications - No data to display  ED Course/ Medical Decision Making/ A&P                            Medical Decision Making Amount and/or Complexity of Data Reviewed Radiology: ordered.   46 year old female presents to the ED for evaluation.  Please see HPI for further details.  On examination, the patient has intact range of motion to her left ankle as well as diffuse swelling.  The patient left knee has intact range of motion.  There is no signs of deformity.  The patient left foot has a 2+ DP pulse.  The patient is able to ambulate without difficulty.  Patient worked up utilizing the following imaging studies interpreted by me personally: - Plain film imaging of left  knee shows no fracture, deformity - Plain film imaging of left tibia/fibula shows no fracture, deformity - Plain film imaging of left ankle shows no fracture, deformity however there is soft tissue swelling  Patient will be placed in Ace wrap and advised to follow-up with her PCP.  The patient was advised to utilize RICE method for pain control.  The patient was given return precautions and she voiced understanding.  The patient had all of her questions answered to her satisfaction.  The patient is stable at this time for discharge home.  Final Clinical Impression(s) / ED Diagnoses Final diagnoses:  Pain of left lower extremity    Rx / DC Orders ED Discharge Orders     None         Clent Ridges 01/01/22 2155    Wynetta Fines, MD 01/01/22 2310

## 2022-01-01 NOTE — ED Triage Notes (Signed)
Pt reports slipped and fell at 6:30 am in her gravel drive. Pt reports left leg & left knee pain throughout the day, also reports numbness and tingling to LLE. Pt requesting xray as she has old hardware to LLE from 2 years ago. Pt was ambulated to triage w/steady gait.

## 2022-03-02 ENCOUNTER — Emergency Department (HOSPITAL_COMMUNITY)
Admission: EM | Admit: 2022-03-02 | Discharge: 2022-03-02 | Disposition: A | Discharge status: Home / Self Care | Payer: Self-pay | Attending: Emergency Medicine | Admitting: Emergency Medicine

## 2022-03-02 ENCOUNTER — Other Ambulatory Visit: Payer: Self-pay

## 2022-03-02 ENCOUNTER — Emergency Department (HOSPITAL_COMMUNITY): Payer: Self-pay

## 2022-03-02 ENCOUNTER — Encounter (HOSPITAL_COMMUNITY): Payer: Self-pay | Admitting: Emergency Medicine

## 2022-03-02 DIAGNOSIS — S63501A Unspecified sprain of right wrist, initial encounter: Secondary | ICD-10-CM | POA: Insufficient documentation

## 2022-03-02 DIAGNOSIS — W010XXA Fall on same level from slipping, tripping and stumbling without subsequent striking against object, initial encounter: Secondary | ICD-10-CM | POA: Insufficient documentation

## 2022-03-02 MED ORDER — IBUPROFEN 800 MG PO TABS
800.0000 mg | ORAL_TABLET | Freq: Once | ORAL | Status: AC
  Administered 2022-03-02: 800 mg via ORAL
  Filled 2022-03-02: qty 1

## 2022-03-02 MED ORDER — IBUPROFEN 600 MG PO TABS: 600.0000 mg | ORAL_TABLET | Freq: Four times a day (QID) | ORAL | 0 refills | Status: DC | PRN

## 2022-03-02 MED ORDER — CYCLOBENZAPRINE HCL 10 MG PO TABS: 10.0000 mg | ORAL_TABLET | Freq: Two times a day (BID) | ORAL | 0 refills | Status: DC | PRN

## 2022-03-02 NOTE — ED Triage Notes (Signed)
Pt reports trip and fall yesterday catching herself with both hands on the ground. Right wrist/thumb pain persists and very painful to move.

## 2022-03-02 NOTE — ED Provider Notes (Signed)
[facility]-EMERGENCY DEPT Provider Note   CSN: [id number] Arrival date & time: 03/02/22  2032     History  Chief Complaint  Patient presents with   Wrist Injury    Shelby Yang is a 46 y.o. female.  The history is provided by the patient and medical records. No language interpreter was used.  Wrist Injury    46 year old female presenting for evaluation of a fall.  2 days ago patient was walking her dog, slipped and fell extending both of her hands to break her fall.  She did not hit her head or loss of consciousness.  She now endorse pain primarily to her right wrist and forearm.  Pain is sharp and achy throbbing worse with movement and she felt that it is swollen.  She tried over-the-counter medication without adequate relief.  She does not complain of any elbow or shoulder pain she denies any numbness or weakness.  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  No significant pain to her fingers.  She is right-hand dominant.  Home Medications Prior to Admission medications   Medication Sig Start Date End Date Taking? Authorizing Provider  aspirin 325 MG tablet Take 162.5 mg by mouth daily as needed for moderate pain.    [provider]  levothyroxine (SYNTHROID) 150 MCG tablet Take 1 tablet (150 mcg total) by mouth daily. 12/18/21   Varney Daily, PA  ondansetron (ZOFRAN ODT) 4 MG disintegrating tablet Take 1 tablet (4 mg total) by mouth every 8 (eight) hours as needed. 09/20/18   Yolonda Kida, MD  traMADol (ULTRAM) 50 MG tablet Take 1-2 tablets (50-100 mg total) by mouth every 6 (six) hours as needed for moderate pain. 09/20/18   Yolonda Kida, MD      Allergies    Codeine and Other    Review of Systems   Review of Systems  All other systems reviewed and are negative.   Physical Exam Updated Vital Signs BP (!) 168/90 (BP Location: Left Arm)   Pulse 65   Temp 98.2 F (36.8 C)  (Oral)   Resp 17   Ht 5\' 5"  (1.651 m)   Wt 118.1 kg   SpO2 100%   BMI 43.33 kg/m  Physical Exam Vitals and nursing note reviewed.  Constitutional:      General: She is not in acute distress.    Appearance: She is well-developed.  HENT:     Head: Atraumatic.  Eyes:     Conjunctiva/sclera: Conjunctivae normal.  Pulmonary:     Effort: Pulmonary effort is normal.  Musculoskeletal:        General: Tenderness (R arm: tenderness about the R wrist with FROM, intact radial pulse and normal grip strength.  R forearm ttp without deformity.  small abrasions noted.) present.     Cervical back: Neck supple.     Comments: R elbow normal  Skin:    Findings: No rash.  Neurological:     Mental Status: She is alert.  Psychiatric:        Mood and Affect: Mood normal.     ED Results / Procedures / Treatments   Labs (all labs ordered are listed, but only abnormal results are displayed) Labs Reviewed - No data to display  EKG None  Radiology DG Wrist Complete Right  Result Date: 03/02/2022 CLINICAL DATA:  Status post fall. EXAM: RIGHT WRIST - COMPLETE 3+ VIEW COMPARISON:  None Available. FINDINGS:  There is no evidence of fracture or dislocation. There is no evidence of arthropathy or other focal bone abnormality. Soft tissues are unremarkable. IMPRESSION: Negative. Electronically Signed   By: Aram Candela M.D.   On: 03/02/2022 21:38   DG Forearm Right  Result Date: 03/02/2022 CLINICAL DATA:  Status post fall. EXAM: RIGHT FOREARM - 2 VIEW COMPARISON:  None Available. FINDINGS: There is no evidence of fracture or other focal bone lesions. Soft tissues are unremarkable. IMPRESSION: Negative. Electronically Signed   By: Aram Candela M.D.   On: 03/02/2022 21:38    Procedures Procedures    Medications Ordered in ED Medications  ibuprofen (ADVIL) tablet 800 mg (800 mg Oral Given 03/02/22 2207)    ED Course/ Medical Decision Making/ A&P                           Medical Decision  Making Amount and/or Complexity of Data Reviewed Radiology: ordered.   BP (!) 168/90 (BP Location: Left Arm)   Pulse 65   Temp 98.2 F (36.8 C) (Oral)   Resp 17   Ht 5\' 5"  (1.651 m)   Wt 118.1 kg   SpO2 100%   BMI 43.33 kg/m   10:18 PM This is a 46 year old female presenting for evaluation of right arm injury.  Patient had a mechanical fall 2 days ago where she used both of her hands to break her fall.  She experienced pain primarily to her right wrist and distal forearm from the impact.  Increasing pain with movement about the wrist.  She denies any significant hand pain no elbow pain or shoulder pain.  She is right-hand dominant.  On exam, she has tenderness about the right wrist with full range of motion.  Radial pulse 2+.  Normal grip strength.  Tenderness along forearm without any deformity.  She has some overlying skin abrasion but no signs of infection.  X-ray of the right wrist and right forearm was obtained independently visualized and interpreted by me and I agree with radiologist interpretation.  X-ray show no acute fracture or dislocation.  Her condition is consistent with a wrist sprain.  RICE therapy discussed.  Wrist brace provided for support.  Hand specialist referral given as needed.  Work note provided as well.  Return precaution given.  Patient given ibuprofen here with improvement of symptoms.  Patient is discharged home with ibuprofen and Flexeril as needed for pain control.         Final Clinical Impression(s) / ED Diagnoses Final diagnoses:  Sprain of right wrist, initial encounter    Rx / DC Orders ED Discharge Orders          Ordered    ibuprofen (ADVIL) 600 MG tablet  Every 6 hours PRN        03/02/22 2220    cyclobenzaprine (FLEXERIL) 10 MG tablet  2 times daily PRN        03/02/22 2220              03/04/22, PA-C 03/02/22 2221    2222, MD 03/03/22 [phone]

## 2022-03-15 ENCOUNTER — Emergency Department (HOSPITAL_COMMUNITY)
Admission: EM | Admit: 2022-03-15 | Discharge: 2022-03-15 | Disposition: A | Discharge status: Home / Self Care | Payer: Self-pay | Attending: Emergency Medicine | Admitting: Emergency Medicine

## 2022-03-15 ENCOUNTER — Encounter (HOSPITAL_COMMUNITY): Payer: Self-pay

## 2022-03-15 DIAGNOSIS — Z7982 Long term (current) use of aspirin: Secondary | ICD-10-CM | POA: Insufficient documentation

## 2022-03-15 DIAGNOSIS — M25531 Pain in right wrist: Secondary | ICD-10-CM | POA: Insufficient documentation

## 2022-03-15 NOTE — ED Triage Notes (Signed)
Pt states that she was seen on 9/13 for a fall and injury to her R wrist, L ankle. Pt was cleared and referred to Ortho for follow up. Pt states that they were unable to see her for approximately four weeks and referred her to ED for pain control. Pt also stating that she will need a doctor's note to return to work. Pt denies any new injury/fall.

## 2022-03-15 NOTE — ED Provider Notes (Signed)
[facility]-EMERGENCY DEPT Provider Note   CSN: [id number] Arrival date & time: 03/15/22  1227     History  Chief Complaint  Patient presents with   Wrist Pain    Shelby Yang is a 46 y.o. female who presents to the ED requesting a work note.  Patient injured her right wrist and has had continued pain since.  Patient states she needs a note to return to work.  No further injury.  Patient declined any pain medication.       Home Medications Prior to Admission medications   Medication Sig Start Date End Date Taking? Authorizing Provider  aspirin 325 MG tablet Take 162.5 mg by mouth daily as needed for moderate pain.    [provider]  cyclobenzaprine (FLEXERIL) 10 MG tablet Take 1 tablet (10 mg total) by mouth 2 (two) times daily as needed for muscle spasms. 03/02/22   Fayrene Helper, PA-C  ibuprofen (ADVIL) 600 MG tablet Take 1 tablet (600 mg total) by mouth every 6 (six) hours as needed. 03/02/22   Fayrene Helper, PA-C  levothyroxine (SYNTHROID) 150 MCG tablet Take 1 tablet (150 mcg total) by mouth daily. 12/18/21   Varney Daily, PA  ondansetron (ZOFRAN ODT) 4 MG disintegrating tablet Take 1 tablet (4 mg total) by mouth every 8 (eight) hours as needed. 09/20/18   Yolonda Kida, MD  traMADol (ULTRAM) 50 MG tablet Take 1-2 tablets (50-100 mg total) by mouth every 6 (six) hours as needed for moderate pain. 09/20/18   Yolonda Kida, MD      Allergies    Codeine and Other    Review of Systems   Review of Systems  Musculoskeletal:  Positive for arthralgias.  Neurological:  Negative for weakness and numbness.    Physical Exam Updated Vital Signs BP (!) 158/82 (BP Location: Left Arm)   Pulse (!) 59   Temp 98 F (36.7 C) (Oral)   Resp 14   Ht 5\' 5"  (1.651 m)   Wt 115.7 kg   SpO2 99%   BMI 42.43 kg/m  Physical Exam Vitals and nursing note reviewed.  Constitutional:      General: She is not in acute distress.    Appearance:  She is not ill-appearing.  HENT:     Head: Normocephalic.  Eyes:     Pupils: Pupils are equal, round, and reactive to light.  Cardiovascular:     Rate and Rhythm: Normal rate and regular rhythm.     Pulses: Normal pulses.     Heart sounds: Normal heart sounds. No murmur heard.    No friction rub. No gallop.  Pulmonary:     Effort: Pulmonary effort is normal.     Breath sounds: Normal breath sounds.  Abdominal:     General: Abdomen is flat. There is no distension.     Palpations: Abdomen is soft.     Tenderness: There is no abdominal tenderness. There is no guarding or rebound.  Musculoskeletal:        General: Normal range of motion.     Cervical back: Neck supple.     Comments: Full ROM of right wrist. Radial pulse intact. Soft compartments.  Skin:    General: Skin is warm and dry.  Neurological:     General: No focal deficit present.     Mental Status: She is alert.  Psychiatric:        Mood and Affect: Mood normal.  Behavior: Behavior normal.     ED Results / Procedures / Treatments   Labs (all labs ordered are listed, but only abnormal results are displayed) Labs Reviewed - No data to display  EKG None  Radiology No results found.  Procedures Procedures    Medications Ordered in ED Medications - No data to display  ED Course/ Medical Decision Making/ A&P                           Medical Decision Making  46 year old female presents to the ED due to continued right wrist pain.  Patient seen on 9/13 with negative x-rays. Patient is right hand dominant.  Patient reports to the ED requesting a work note.  Declined any pain medication.  Patient follows Dr. Stann Mainland with orthopedics.  Upon arrival, stable vitals.  Patient in no acute distress.  Right upper extremity neurovascularly intact with soft compartments.  Low suspicion for compartment syndrome.  Work note provided. Strict ED precautions discussed with patient. Patient states understanding and agrees to  plan. Patient discharged home in no acute distress and stable vitals         Final Clinical Impression(s) / ED Diagnoses Final diagnoses:  None    Rx / DC Orders ED Discharge Orders     None         Karie Kirks 03/15/22 1317    Godfrey Pick, MD 03/15/22 1615

## 2022-04-13 ENCOUNTER — Ambulatory Visit: Payer: Self-pay | Admitting: Orthopaedic Surgery

## 2022-04-27 ENCOUNTER — Encounter: Payer: Self-pay | Admitting: Orthopaedic Surgery

## 2022-04-27 ENCOUNTER — Ambulatory Visit (INDEPENDENT_AMBULATORY_CARE_PROVIDER_SITE_OTHER): Payer: Self-pay | Admitting: Orthopaedic Surgery

## 2022-04-27 VITALS — BP 139/84 | HR 59 | Ht 64.0 in | Wt 255.0 lb

## 2022-04-27 DIAGNOSIS — M6281 Muscle weakness (generalized): Secondary | ICD-10-CM | POA: Insufficient documentation

## 2022-04-27 NOTE — Progress Notes (Addendum)
Office Visit Note   Patient: Shelby Yang           Date of Birth: April 01, 1976           MRN: [id number] Visit Date: 04/27/2022              Requested by: No referring provider defined for this encounter. PCP: Pcp, No   Assessment & Plan: Visit Diagnoses: Left quadriceps weakness 3 years post tibial nail.   2.  Left lower extremity edema with venous insufficiency.  Plan: Patient has venous insufficiency left lower extremity worse since her tib-fib fracture.  TED hose not applied since we did not have her size and we will send her to [facility] for appropriate TED hose size..  She needs to put them on first thing in the morning.  At the end of the visit she suddenly brought out some paperwork and states that she does not have her FMLA signed since she was in the emergency room on 03/03/2022 she is concerned she might not be able to keep her job.  I discussed patient have not seen her besides today during this visit able to cover her from 2 months ago.  Follow-Up Instructions: No follow-ups on file.   Orders:  No orders of the defined types were placed in this encounter.  No orders of the defined types were placed in this encounter.     Procedures: No procedures performed   Clinical Data: No additional findings.   Subjective: Chief Complaint  Patient presents with   Left Leg - Pain    HPI 46 year old female first-time visit with a history of tibial fracture treated with intramedullary nail by Dr. Aundria Rud 3 years ago.  Patient is back at work at the end of the visit she mentioned that she missed almost all over the month of September after she was emergency room for swelling of her left foot and ankle.  She had Doppler test which were negative for DVT.  Post tibial fracture with rodding she has had continued edema and slight discoloration over her leg without pretibial ulceration.    Review of Systems all systems noncontributory HPI.   Objective: Vital Signs: BP  139/84   Pulse (!) 59   Ht 5\' 4"  (1.626 m)   Wt 255 lb (115.7 kg)   BMI 43.77 kg/m   Physical Exam Constitutional:      Appearance: She is well-developed.  HENT:     Head: Normocephalic.     Right Ear: External ear normal.     Left Ear: External ear normal. There is no impacted cerumen.  Eyes:     Pupils: Pupils are equal, round, and reactive to light.  Neck:     Thyroid: No thyromegaly.     Trachea: No tracheal deviation.  Cardiovascular:     Rate and Rhythm: Normal rate.  Pulmonary:     Effort: Pulmonary effort is normal.  Abdominal:     Palpations: Abdomen is soft.  Musculoskeletal:     Cervical back: No rigidity.  Skin:    General: Skin is warm and dry.  Neurological:     Mental Status: She is alert and oriented to person, place, and time.  Psychiatric:        Behavior: Behavior normal.     Ortho Exam patient has bilateral pitting edema worse on the left than right leg swelling in the right lower extremity but more significant swelling left foot and leg from the knee down.  That her foot is about 2 sizes larger than the opposite.  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  Pulses are normal good capillary refill no neuropathy.  Patient has significant quad weakness ambulates with a limp cannot go up a step single step first.  She has incision proximal.  She had quad splinting tibial nail insertion site.  Specialty Comments:  No specialty comments available.  Imaging: No results found.   PMFS History: Patient Active Problem List   Diagnosis Date Noted   Quadriceps weakness 04/27/2022   Closed left tibial fracture 09/17/2018   Past Medical History:  Diagnosis Date   Asthma    Environmental allergies    Prediabetes     No family history on file.  Past Surgical History:  Procedure Laterality Date   radiation to thyroid  1999   TIBIA IM NAIL INSERTION Left 09/18/2018   Procedure: INTRAMEDULLARY (IM) NAIL TIBIAL;  Surgeon:  Yolonda Kida, MD;  Location: [facility] OR;  Service: Orthopedics;  Laterality: Left;   TONSILLECTOMY  1993   Social History   Occupational History   Not on file  Tobacco Use   Smoking status: Never   Smokeless tobacco: Never  Vaping Use   Vaping Use: Never used  Substance and Sexual Activity   Alcohol use: Never   Drug use: Never   Sexual activity: Yes

## 2022-08-31 ENCOUNTER — Other Ambulatory Visit: Payer: Self-pay

## 2022-08-31 ENCOUNTER — Emergency Department (HOSPITAL_COMMUNITY)
Admission: EM | Admit: 2022-08-31 | Discharge: 2022-08-31 | Disposition: A | Discharge status: Home / Self Care | Payer: Self-pay | Attending: Emergency Medicine | Admitting: Emergency Medicine

## 2022-08-31 DIAGNOSIS — Z760 Encounter for issue of repeat prescription: Secondary | ICD-10-CM | POA: Insufficient documentation

## 2022-08-31 MED ORDER — LEVOTHYROXINE SODIUM 150 MCG PO TABS: 150.0000 ug | ORAL_TABLET | Freq: Every day | ORAL | 0 refills | Status: DC

## 2022-08-31 NOTE — ED Triage Notes (Signed)
C/o needing med refill for synthroid due to doctor retiring and unable to get appt until may. Yesterday was last dose.

## 2022-08-31 NOTE — Discharge Instructions (Signed)
Please schedule an appointment with a primary care provider.

## 2022-08-31 NOTE — ED Provider Notes (Signed)
[place] EMERGENCY DEPARTMENT AT [facility] Provider Note   CSN: [id number] Arrival date & time: 08/31/22  1409     History  Chief Complaint  Patient presents with   Medication Refill    Shelby Yang is a 47 y.o. female.   Medication Refill  Patient is a 47 year old female present emergency room today with complaints of medication refill  She states that her PCP recently retired and she is in the process of finding a new primary care doctor.  She had her thyroid removed years ago and has been on thyroid ever since.  She is on 150 mg daily she states that she has been on this dose for quite some time and only ran out of her medication today.  She states her last dose was yesterday.  She denies any symptoms.    Home Medications Prior to Admission medications   Medication Sig Start Date End Date Taking? Authorizing Provider  aspirin 325 MG tablet Take 162.5 mg by mouth daily as needed for moderate pain.    [provider]  cyclobenzaprine (FLEXERIL) 10 MG tablet Take 1 tablet (10 mg total) by mouth 2 (two) times daily as needed for muscle spasms. 03/02/22   Domenic Moras, PA-C  ibuprofen (ADVIL) 600 MG tablet Take 1 tablet (600 mg total) by mouth every 6 (six) hours as needed. 03/02/22   Domenic Moras, PA-C  levothyroxine (SYNTHROID) 150 MCG tablet Take 1 tablet (150 mcg total) by mouth daily. 08/31/22   Newell Frater, Kathleene Hazel, PA  ondansetron (ZOFRAN ODT) 4 MG disintegrating tablet Take 1 tablet (4 mg total) by mouth every 8 (eight) hours as needed. Patient not taking: Reported on 04/27/2022 09/20/18   Nicholes Stairs, MD  traMADol (ULTRAM) 50 MG tablet Take 1-2 tablets (50-100 mg total) by mouth every 6 (six) hours as needed for moderate pain. Patient not taking: Reported on 04/27/2022 09/20/18   Nicholes Stairs, MD      Allergies    Codeine and Other    Review of Systems   Review of Systems  Physical Exam Updated Vital Signs BP (!) 148/82 (BP  Location: Right Arm)   Pulse 63   Temp 98.2 F (36.8 C) (Oral)   Resp 15   Wt 115 kg   SpO2 100%   BMI 43.52 kg/m  Physical Exam Vitals and nursing note reviewed.  Constitutional:      General: She is not in acute distress.    Appearance: Normal appearance. She is not ill-appearing.  HENT:     Head: Normocephalic and atraumatic.  Eyes:     General: No scleral icterus.       Right eye: No discharge.        Left eye: No discharge.     Conjunctiva/sclera: Conjunctivae normal.  Pulmonary:     Effort: Pulmonary effort is normal.     Breath sounds: No stridor.  Neurological:     Mental Status: She is alert and oriented to person, place, and time. Mental status is at baseline.     ED Results / Procedures / Treatments   Labs (all labs ordered are listed, but only abnormal results are displayed) Labs Reviewed - No data to display  EKG None  Radiology No results found.  Procedures Procedures    Medications Ordered in ED Medications - No data to display  ED Course/ Medical Decision Making/ A&P  Medical Decision Making Risk Prescription drug management.   Patient is a 47 year old female present emergency room today with complaints of medication refill  She states that her PCP recently retired and she is in the process of finding a new primary care doctor.  She had her thyroid removed years ago and has been on thyroid ever since.  She is on 150 mg daily she states that she has been on this dose for quite some time and only ran out of her medication today.  She states her last dose was yesterday.  She denies any symptoms.  Patient notes is without symptoms  Physical exam unremarkable  Vital signs normal  She will follow-up with PCP  Final Clinical Impression(s) / ED Diagnoses Final diagnoses:  Medication refill    Rx / DC Orders ED Discharge Orders          Ordered    levothyroxine (SYNTHROID) 150 MCG tablet  Daily,   Status:   Discontinued        08/31/22 1451    levothyroxine (SYNTHROID) 150 MCG tablet  Daily        08/31/22 1509              Tedd Sias, [place] 08/31/22 1526    Wyvonnia Dusky, MD 08/31/22 1620

## 2023-02-14 ENCOUNTER — Other Ambulatory Visit: Payer: Self-pay

## 2023-02-14 ENCOUNTER — Emergency Department (HOSPITAL_COMMUNITY)
Admission: EM | Admit: 2023-02-14 | Discharge: 2023-02-14 | Discharge status: Against Medical Advice | Payer: Self-pay | Attending: Emergency Medicine | Admitting: Emergency Medicine

## 2023-02-14 ENCOUNTER — Encounter (HOSPITAL_COMMUNITY): Payer: Self-pay

## 2023-02-14 DIAGNOSIS — Z5321 Procedure and treatment not carried out due to patient leaving prior to being seen by health care provider: Secondary | ICD-10-CM | POA: Insufficient documentation

## 2023-02-14 DIAGNOSIS — H53149 Visual discomfort, unspecified: Secondary | ICD-10-CM | POA: Insufficient documentation

## 2023-02-14 DIAGNOSIS — R519 Headache, unspecified: Secondary | ICD-10-CM | POA: Insufficient documentation

## 2023-02-14 NOTE — ED Triage Notes (Signed)
Pt arrived POV with reports of Headache and photophobia for 3 weeks. Unable to see PCP. Reports lot of pressure in eyes, no blurry vision. Has been taking tylenol, minimal relief.  Patient denies hx of migraine, N/V or any other symptoms

## 2023-03-26 ENCOUNTER — Emergency Department
Admission: EM | Admit: 2023-03-26 | Discharge: 2023-03-26 | Disposition: A | Discharge status: Home / Self Care | Payer: Self-pay | Attending: Emergency Medicine | Admitting: Emergency Medicine

## 2023-03-26 ENCOUNTER — Other Ambulatory Visit: Payer: Self-pay

## 2023-03-26 DIAGNOSIS — Z760 Encounter for issue of repeat prescription: Secondary | ICD-10-CM | POA: Insufficient documentation

## 2023-03-26 MED ORDER — LEVOTHYROXINE SODIUM 150 MCG PO TABS: 150.0000 ug | ORAL_TABLET | Freq: Every day | ORAL | 0 refills | Status: DC

## 2023-03-26 NOTE — ED Triage Notes (Signed)
Pt to ED via POV from home. Pt reports PCP retired and new PCP cannot see her for 30 days. Pt reports ran out of levothyroxine yesterday and needs refill.

## 2023-03-26 NOTE — Discharge Instructions (Signed)
Your medication was refilled for you.  Please follow-up with your outpatient provider.  Please return for any new, worsening, or change in symptoms or other concerns.  It was a pleasure caring for you today.

## 2023-03-26 NOTE — ED Provider Notes (Signed)
[facility] Provider Note    Event Date/Time   First MD Initiated Contact with Patient 03/26/23 1238     (approximate)   History   Medication Refill   HPI  Shelby Yang is a 46 y.o. female with a past medical history of thyroid disease who presents today with request for Synthroid.  Patient reports that her PCP retired and she cannot see her new PCP for 30 days.  She reports that she ran out of her levothyroxine yesterday and needs a refill.  Otherwise she has no complaints.  She denies any symptoms.  Patient Active Problem List   Diagnosis Date Noted   Quadriceps weakness 04/27/2022   Closed left tibial fracture 09/17/2018          Physical Exam   Triage Vital Signs: ED Triage Vitals  Encounter Vitals Group     BP 03/26/23 1235 (!) 168/74     Systolic BP Percentile --      Diastolic BP Percentile --      Pulse Rate 03/26/23 1234 (!) 59     Resp 03/26/23 1234 20     Temp 03/26/23 1234 98 F (36.7 C)     Temp Source 03/26/23 1234 Oral     SpO2 03/26/23 1234 100 %     Weight --      Height --      Head Circumference --      Peak Flow --      Pain Score 03/26/23 1234 0     Pain Loc --      Pain Education --      Exclude from Growth Chart --     Most recent vital signs: Vitals:   03/26/23 1234 03/26/23 1235  BP:  (!) 168/74  Pulse: (!) 59   Resp: 20   Temp: 98 F (36.7 C)   SpO2: 100%     Physical Exam Vitals and nursing note reviewed.  Constitutional:      General: Awake and alert. No acute distress.    Appearance: Normal appearance. The patient is overweight.  HENT:     Head: Normocephalic and atraumatic.     Mouth: Mucous membranes are moist.  Eyes:     General: PERRL. Normal EOMs        Right eye: No discharge.        Left eye: No discharge.     Conjunctiva/sclera: Conjunctivae normal.  Cardiovascular:     Rate and Rhythm: Normal rate and regular rhythm.     Pulses: Normal pulses.  Pulmonary:     Effort:  Pulmonary effort is normal. No respiratory distress.     Breath sounds: Normal breath sounds.  Abdominal:     Abdomen is soft. There is no abdominal tenderness. No rebound or guarding. No distention. Musculoskeletal:        General: No swelling. Normal range of motion.     Cervical back: Normal range of motion and neck supple.  Skin:    General: Skin is warm and dry.     Capillary Refill: Capillary refill takes less than 2 seconds.     Findings: No rash.  Neurological:     Mental Status: The patient is awake and alert.      ED Results / Procedures / Treatments   Labs (all labs ordered are listed, but only abnormal results are displayed) Labs Reviewed - No data to display   EKG     RADIOLOGY  PROCEDURES:  Critical Care performed:   Procedures   MEDICATIONS ORDERED IN ED: Medications - No data to display   IMPRESSION / MDM / ASSESSMENT AND PLAN / ED COURSE  I reviewed the triage vital signs and the nursing notes.   Differential diagnosis includes, but is not limited to, medication refill, thyroid disease, hypothyroid.  Patient is awake and alert, hemodynamically stable and afebrile.  She is asymptomatic.  I reviewed her chart, she has been on Synthroid 150 mcg.  She has the empty bottle with her today.  Her medication was refilled for her per her request.  We discussed return precautions and outpatient follow-up.  Patient returns and agrees with plan.  She was discharged in stable condition.   Patient's presentation is most consistent with acute complicated illness / injury requiring diagnostic workup.    FINAL CLINICAL IMPRESSION(S) / ED DIAGNOSES   Final diagnoses:  Medication refill     Rx / DC Orders   ED Discharge Orders          Ordered    levothyroxine (SYNTHROID) 150 MCG tablet  Daily        03/26/23 1300             Note:  This document was prepared using Dragon voice recognition software and may include unintentional dictation  errors.   Jackelyn Hoehn, PA-C 03/26/23 1304    Concha Se, MD 03/29/23 [phone]

## 2023-10-03 ENCOUNTER — Encounter: Payer: Self-pay | Admitting: Intensive Care

## 2023-10-03 ENCOUNTER — Other Ambulatory Visit: Payer: Self-pay

## 2023-10-03 ENCOUNTER — Emergency Department
Admission: EM | Admit: 2023-10-03 | Discharge: 2023-10-03 | Disposition: A | Discharge status: Home / Self Care | Payer: Self-pay | Attending: Emergency Medicine | Admitting: Emergency Medicine

## 2023-10-03 DIAGNOSIS — S025XXB Fracture of tooth (traumatic), initial encounter for open fracture: Secondary | ICD-10-CM | POA: Insufficient documentation

## 2023-10-03 DIAGNOSIS — E039 Hypothyroidism, unspecified: Secondary | ICD-10-CM | POA: Insufficient documentation

## 2023-10-03 DIAGNOSIS — J45909 Unspecified asthma, uncomplicated: Secondary | ICD-10-CM | POA: Insufficient documentation

## 2023-10-03 DIAGNOSIS — X58XXXA Exposure to other specified factors, initial encounter: Secondary | ICD-10-CM | POA: Insufficient documentation

## 2023-10-03 DIAGNOSIS — K029 Dental caries, unspecified: Secondary | ICD-10-CM | POA: Insufficient documentation

## 2023-10-03 HISTORY — DX: Hypothyroidism, unspecified: E03.9

## 2023-10-03 HISTORY — DX: Disorder of thyroid, unspecified: E07.9

## 2023-10-03 HISTORY — DX: Thyrotoxicosis with diffuse goiter without thyrotoxic crisis or storm: E05.00

## 2023-10-03 MED ORDER — AMOXICILLIN 400 MG/5ML PO SUSR: 500.0000 mg | Freq: Three times a day (TID) | ORAL | 0 refills | Status: AC

## 2023-10-03 NOTE — ED Provider Notes (Signed)
 [facility] Emergency Department Provider Note     Event Date/Time   First MD Initiated Contact with Patient 10/03/23 1642     (approximate)   History   Dental Pain   HPI  Shelby Yang is a 48 y.o. female with a history of asthma, hypothyroidism, Graves' disease, and prediabetes, presents to the ED endorsing dental pain.  Patient would endorse onset of symptoms on Saturday, 3 days prior to arrival.  She reports a recently broken crown to the first molar in the area of concern.  She has made contact with a local dental provider who can see her in 6 weeks.  They recommended that she report to the ED to start an empiric course of antibiotics.  No difficulty breathing, swallowing, or controlling oral secretions is reported.  Denies any fevers, chills, sweats, or chest pain.  Physical Exam   Triage Vital Signs: ED Triage Vitals  Encounter Vitals Group     BP --      Systolic BP Percentile --      Diastolic BP Percentile --      Pulse Rate 10/03/23 1546 67     Resp 10/03/23 1546 18     Temp 10/03/23 1546 (!) 97.5 F (36.4 C)     Temp Source 10/03/23 1546 Oral     SpO2 10/03/23 1546 100 %     Weight 10/03/23 1547 215 lb (97.5 kg)     Height 10/03/23 1547 5\' 4"  (1.626 m)     Head Circumference --      Peak Flow --      Pain Score 10/03/23 1547 6     Pain Loc --      Pain Education --      Exclude from Growth Chart --     Most recent vital signs: Vitals:   10/03/23 1546  Pulse: 67  Resp: 18  Temp: (!) 97.5 F (36.4 C)  SpO2: 100%    General Awake, no distress. NAD HEENT NCAT. PERRL. EOMI. No rhinorrhea. Mucous membranes are moist.  Uvula is midline and tonsils are flat.  No oropharyngeal lesions appreciated.  The left upper first molar, with a large occlusal surface defect consistent with recent loss of artificial crown.  No surrounding gum erythema, pointing, or fluctuance.  No proximal edema is noted. CV:  Good peripheral perfusion.   RESP:  Normal effort.  ABD:  No distention.   ED Results / Procedures / Treatments   Labs (all labs ordered are listed, but only abnormal results are displayed) Labs Reviewed - No data to display   EKG   RADIOLOGY  No results found.   PROCEDURES:  Critical Care performed: No  Procedures   MEDICATIONS ORDERED IN ED: Medications - No data to display   IMPRESSION / MDM / ASSESSMENT AND PLAN / ED COURSE  I reviewed the triage vital signs and the nursing notes.                              Differential diagnosis includes, but is not limited to, dental infection, dental caries, dental abscess, gingivitis, necrotizing gingivitis, Ludwig's angina  Patient's presentation is most consistent with acute presentation with potential threat to life or bodily function.  Patient's diagnosis is consistent with left upper first molar dental fracture and cavity. Patient will be discharged home with prescriptions for amoxicillin suspension as requested. Patient is to follow up with her  dental provider as scheduled, as scheduled, as needed or otherwise directed.  Patient is also given referral information for endocrinology as requested.  Patient is given ED precautions to return to the ED for any worsening or new symptoms.     FINAL CLINICAL IMPRESSION(S) / ED DIAGNOSES   Final diagnoses:  Pain due to dental caries  Open fracture of tooth, initial encounter     Rx / DC Orders   ED Discharge Orders          Ordered    amoxicillin (AMOXIL) 400 MG/5ML suspension  3 times daily        10/03/23 1712             Note:  This document was prepared using Dragon voice recognition software and may include unintentional dictation errors.    May Sparks, PA-C 10/03/23 1717    Shane Darling, MD 10/03/23 1726

## 2023-10-03 NOTE — ED Triage Notes (Signed)
 Patient c/o dental pain that started Saturday. Reports her tooth broke

## 2023-10-03 NOTE — Discharge Instructions (Signed)
Take the antibiotic as directed. Follow-up with your dental provider as scheduled.

## 2023-10-03 NOTE — ED Notes (Signed)
 ..  The patient is A&OX4, ambulatory at d/c with independent steady gait, NAD. Pt verbalized understanding of d/c instructions, prescription and follow up care.

## 2023-12-07 ENCOUNTER — Emergency Department (HOSPITAL_COMMUNITY): Payer: Self-pay

## 2023-12-07 ENCOUNTER — Emergency Department (HOSPITAL_COMMUNITY)
Admission: EM | Admit: 2023-12-07 | Discharge: 2023-12-07 | Disposition: A | Discharge status: Home / Self Care | Payer: Self-pay | Attending: Emergency Medicine | Admitting: Emergency Medicine

## 2023-12-07 ENCOUNTER — Encounter (HOSPITAL_COMMUNITY): Payer: Self-pay

## 2023-12-07 DIAGNOSIS — E871 Hypo-osmolality and hyponatremia: Secondary | ICD-10-CM | POA: Insufficient documentation

## 2023-12-07 DIAGNOSIS — W5501XA Bitten by cat, initial encounter: Secondary | ICD-10-CM | POA: Insufficient documentation

## 2023-12-07 DIAGNOSIS — S61231A Puncture wound without foreign body of left index finger without damage to nail, initial encounter: Secondary | ICD-10-CM | POA: Insufficient documentation

## 2023-12-07 LAB — CBC WITH DIFFERENTIAL/PLATELET
Abs Immature Granulocytes: 0.02 10*3/uL (ref 0.00–0.07)
Basophils Absolute: 0.1 10*3/uL (ref 0.0–0.1)
Basophils Relative: 1 %
Eosinophils Absolute: 0.3 10*3/uL (ref 0.0–0.5)
Eosinophils Relative: 4 %
HCT: 40.2 % (ref 36.0–46.0)
Hemoglobin: 13.5 g/dL (ref 12.0–15.0)
Immature Granulocytes: 0 %
Lymphocytes Relative: 29 %
Lymphs Abs: 2.2 10*3/uL (ref 0.7–4.0)
MCH: 33 pg (ref 26.0–34.0)
MCHC: 33.6 g/dL (ref 30.0–36.0)
MCV: 98.3 fL (ref 80.0–100.0)
Monocytes Absolute: 0.5 10*3/uL (ref 0.1–1.0)
Monocytes Relative: 6 %
Neutro Abs: 4.5 10*3/uL (ref 1.7–7.7)
Neutrophils Relative %: 60 %
Platelets: 324 10*3/uL (ref 150–400)
RBC: 4.09 MIL/uL (ref 3.87–5.11)
RDW: 13.3 % (ref 11.5–15.5)
WBC: 7.5 10*3/uL (ref 4.0–10.5)
nRBC: 0 % (ref 0.0–0.2)

## 2023-12-07 LAB — BASIC METABOLIC PANEL WITH GFR
Anion gap: 6 (ref 5–15)
BUN: 9 mg/dL (ref 6–20)
CO2: 27 mmol/L (ref 22–32)
Calcium: 9 mg/dL (ref 8.9–10.3)
Chloride: 101 mmol/L (ref 98–111)
Creatinine, Ser: 0.89 mg/dL (ref 0.44–1.00)
GFR, Estimated: >60 mL/min (ref >60)
Glucose, Bld: 160 mg/dL — ABNORMAL HIGH (ref 70–99)
Potassium: 3.8 mmol/L (ref 3.5–5.1)
Sodium: 134 mmol/L — ABNORMAL LOW (ref 135–145)

## 2023-12-07 MED ORDER — AMOXICILLIN-POT CLAVULANATE 875-125 MG PO TABS: 1.0000 | ORAL_TABLET | Freq: Two times a day (BID) | ORAL | 0 refills | Status: DC

## 2023-12-07 MED ORDER — AMOXICILLIN-POT CLAVULANATE 400-57 MG/5ML PO SUSR: 800.0000 mg | Freq: Three times a day (TID) | ORAL | 0 refills | Status: AC

## 2023-12-07 NOTE — ED Triage Notes (Signed)
 Pt c/o pain and swelling to L hand after being bit by her cat last night.  Pain score 6/10.

## 2023-12-07 NOTE — ED Notes (Signed)
 Hand wrapped with ace bandage for comfort

## 2023-12-07 NOTE — Discharge Instructions (Addendum)
 You were seen in the emergency department today for concerns of a cat bite.  Your labs and imaging were thankfully reassuring no signs of any fractures.  You do appear to have infection that is developing in this area so I am starting you on an antibiotic called Augmentin which will be taken twice daily for neck 7 days.  Please take this as prescribed.  For any concerns of new or worsening symptoms, return to the emergency department.

## 2023-12-07 NOTE — ED Provider Notes (Signed)
 [facility] EMERGENC[facility] DEPARTMENT AT [facility] Provider Note   CSN: [id number] Arrival date & time: 12/07/23  1313     Patient presents with: Animal Bite   Shelby Yang is a 48 y.o. female.  Patient presents to the emergency department today with concerns of animal bite.  She reports that she sustained a cat bite to her left index finger last night.  Reports that this Was her own personal cat who is up-to-date on all immunizations.  Reports that the cat is a house cat.  She states that she cleaned out the wounds starting last night but has had notable worsening in swelling and pain today.  Denies any fever, chills or bodyaches.   Animal Bite      Prior to Admission medications   Medication Sig Start Date End Date Taking? Authorizing Provider  amoxicillin -clavulanate (AUGMENTIN) 400-57 MG/5ML suspension Take 10 mLs (800 mg total) by mouth 3 (three) times daily for 7 days. 12/07/23 12/14/23 Yes Johny Pitstick A, PA-C  aspirin 325 MG tablet Take 162.5 mg by mouth daily as needed for moderate pain.    [provider]  levothyroxine  (SYNTHROID ) 150 MCG tablet Take 1 tablet (150 mcg total) by mouth daily. 03/26/23 04/25/23  Poggi, Jenna E, PA-C    Allergies: Codeine, Other, and Morphine    Review of Systems  Skin:  Positive for wound.  All other systems reviewed and are negative.   Updated Vital Signs BP (!) 170/88   Pulse 65   Temp 97.6 F (36.4 C) (Oral)   Resp 16   Ht 5' 4 (1.626 m)   Wt 97.5 kg   SpO2 100%   BMI 36.90 kg/m   Physical Exam Vitals and nursing note reviewed.  Constitutional:      General: She is not in acute distress.    Appearance: She is well-developed.  HENT:     Head: Normocephalic and atraumatic.   Eyes:     Conjunctiva/sclera: Conjunctivae normal.    Cardiovascular:     Rate and Rhythm: Normal rate and regular rhythm.     Heart sounds: No murmur heard. Pulmonary:     Effort: Pulmonary effort is normal. No  respiratory distress.     Breath sounds: Normal breath sounds.  Abdominal:     Palpations: Abdomen is soft.     Tenderness: There is no abdominal tenderness.   Musculoskeletal:        General: No swelling.     Cervical back: Neck supple.   Skin:    General: Skin is warm and dry.     Capillary Refill: Capillary refill takes less than 2 seconds.     Comments: Left hand erythema and edema swelling present.  The dorsum of the left hand.  Multiple punctures noted to the left index finger.  No active bleeding.   Neurological:     Mental Status: She is alert.   Psychiatric:        Mood and Affect: Mood normal.     (all labs ordered are listed, but only abnormal results are displayed) Labs Reviewed  BASIC METABOLIC PANEL WITH GFR - Abnormal; Notable for the following components:      Result Value   Sodium 134 (*)    Glucose, Bld 160 (*)    All other components within normal limits  CBC WITH DIFFERENTIAL/PLATELET    EKG: None  Radiology: DG Hand Complete Left Result Date: 12/07/2023 CLINICAL DATA:  Cat bite EXAM: LEFT HAND - COMPLETE  3+ VIEW COMPARISON:  12/10/2021 FINDINGS: There is no evidence of fracture or dislocation. There is no evidence of arthropathy or other focal bone abnormality. Soft tissue swelling at the dorsum of the hand as well as the index and middle fingers. IMPRESSION: No acute osseous abnormality. Soft tissue swelling. Electronically Signed   By: Esmeralda Hedge M.D.   On: 12/07/2023 16:10     Procedures   Medications Ordered in the ED - No data to display                                  Medical Decision Making Amount and/or Complexity of Data Reviewed Radiology: ordered.  Risk Prescription drug management.   This patient presents to the ED for concern of animal bite differential diagnosis includes animal bite, cellulitis, finger fracture, ligamental injury   Lab Tests:  I Ordered, and personally interpreted labs.  The pertinent results  include: CBC unremarkable, CMP with sodium at 134   Imaging Studies ordered:  I ordered imaging studies including left hand x-ray I independently visualized and interpreted imaging which showed negative for any acute findings. I agree with the radiologist interpretation   Problem List / ED Course:  Patient presents to the emergency department concerns of an animal bite.  Reports she sustained a cat bite to the left index finger last night.  States she washed the wound out at that time.  The cat is her own pet and is up-to-date on immunizations and is a house cat. On exam, there is some erythema and swelling to the index finger with slight extension towards the dorsum of the left hand.  Patient is able to move the left index finger with some difficulty due to pain likely due to the extent of swelling.  There is no streaking erythema extending into the left forearm.  Basic labs ordered as well as xray of the left hand. Basic labs unremarkable.  WBC normal at 7.5.  X-ray of the left hand is unremarkable but there is some soft tissue swelling seen. No Tdap date recorded on chart but last Tdap based on chart review appears to have been in 2022. Will hold off on Tdap at this time.  Discharging home on a course of Augmentin twice daily for the next 7 days.  Advise return precautions such as worsening pain, swelling, or development of erythematous streaking up the left arm.  Patient verbalized understanding treatment plan all return precautions.  Discharged home in stable condition.  Final diagnoses:  Cat bite, initial encounter    ED Discharge Orders          Ordered    Apply wrap        12/07/23 1626    amoxicillin -clavulanate (AUGMENTIN) 875-125 MG tablet  Every 12 hours,   Status:  Discontinued        12/07/23 1627    amoxicillin -clavulanate (AUGMENTIN) 400-57 MG/5ML suspension  3 times daily        12/07/23 1706               Tayelor Osborne A, PA-C 12/07/23 1726    Merdis Stalling, MD 12/07/23 1757

## 2024-02-16 ENCOUNTER — Other Ambulatory Visit: Payer: Self-pay

## 2024-02-16 ENCOUNTER — Emergency Department
Admission: EM | Admit: 2024-02-16 | Discharge: 2024-02-16 | Disposition: A | Discharge status: Home / Self Care | Attending: Emergency Medicine | Admitting: Emergency Medicine

## 2024-02-16 DIAGNOSIS — Z760 Encounter for issue of repeat prescription: Secondary | ICD-10-CM | POA: Insufficient documentation

## 2024-02-16 DIAGNOSIS — J45909 Unspecified asthma, uncomplicated: Secondary | ICD-10-CM | POA: Diagnosis not present

## 2024-02-16 DIAGNOSIS — E039 Hypothyroidism, unspecified: Secondary | ICD-10-CM | POA: Insufficient documentation

## 2024-02-16 MED ORDER — LEVOTHYROXINE SODIUM 150 MCG PO TABS: 150.0000 ug | ORAL_TABLET | Freq: Every day | ORAL | 0 refills | Status: DC

## 2024-02-16 NOTE — ED Notes (Signed)
 See triage note  Presents requesting a refill of her thyroid  Denies nay other sxs'

## 2024-02-16 NOTE — ED Provider Notes (Signed)
   [facility] Provider Note    Event Date/Time   First MD Initiated Contact with Patient 02/16/24 [phone]     (approximate)   History   Medication Refill   HPI  Shelby Yang is a 48 y.o. female with history of asthma, Graves' disease, and as listed in EMR presents to the emergency department for medication refill.  She has been on levothyroxine  150 mcg for many years and ran out 2 days ago.  Her primary care provider retired and she has an upcoming appointment with someone new, but she ran out before her appointment could be scheduled.  No medical complaints at this time.SABRA     Physical Exam    Vitals:   02/16/24 0759  BP: (!) 149/80  Pulse: 61  Resp: 18  Temp: 98.1 F (36.7 C)  SpO2: 98%    General: Awake, no distress.  CV:  Good peripheral perfusion.  Resp:  Normal effort.  Abd:  No distention.  Other:     ED Results / Procedures / Treatments   Labs (all labs ordered are listed, but only abnormal results are displayed)  Labs Reviewed - No data to display   EKG  Not indicated   RADIOLOGY  Image and radiology report reviewed and interpreted by me. Radiology report consistent with the same.  Not indicated  PROCEDURES:  Critical Care performed: No  Procedures   MEDICATIONS ORDERED IN ED:  Medications - No data to display   IMPRESSION / MDM / ASSESSMENT AND PLAN / ED COURSE   I have reviewed the triage note and vital signs. Vital signs stable   Differential diagnosis includes, but is not limited to, medication refill, hypothyroidism  Patient's presentation is most consistent with acute, uncomplicated illness.  48 year old female presenting to the emergency department for refill of her thyroid medication.  She has been out for the past 2 days.  She is having no medical complaints at present.  Prescription has been submitted to the patient's pharmacy and she was discharged in stable condition.      FINAL  CLINICAL IMPRESSION(S) / ED DIAGNOSES   Final diagnoses:  Encounter for medication refill  Hypothyroidism, unspecified type     Rx / DC Orders   ED Discharge Orders          Ordered    levothyroxine  (SYNTHROID ) 150 MCG tablet  Daily        02/16/24 0819             Note:  This document was prepared using Dragon voice recognition software and may include unintentional dictation errors.   Herlinda Kirk NOVAK, FNP 02/16/24 9151    Dicky Anes, MD 02/16/24 1600

## 2024-02-16 NOTE — ED Triage Notes (Signed)
 Pt to ED via POV from home. Pt reports ran our of synthroid  medication 2 days ago and needing refill.   Levothyroxine  150mcg 1 tablet by mouth daily

## 2024-02-25 ENCOUNTER — Emergency Department
Admission: EM | Admit: 2024-02-25 | Discharge: 2024-02-25 | Disposition: A | Discharge status: Home / Self Care | Attending: Emergency Medicine | Admitting: Emergency Medicine

## 2024-02-25 ENCOUNTER — Other Ambulatory Visit: Payer: Self-pay

## 2024-02-25 DIAGNOSIS — E039 Hypothyroidism, unspecified: Secondary | ICD-10-CM | POA: Insufficient documentation

## 2024-02-25 DIAGNOSIS — K047 Periapical abscess without sinus: Secondary | ICD-10-CM | POA: Diagnosis not present

## 2024-02-25 DIAGNOSIS — J45909 Unspecified asthma, uncomplicated: Secondary | ICD-10-CM | POA: Diagnosis not present

## 2024-02-25 DIAGNOSIS — K029 Dental caries, unspecified: Secondary | ICD-10-CM | POA: Diagnosis not present

## 2024-02-25 DIAGNOSIS — K0889 Other specified disorders of teeth and supporting structures: Secondary | ICD-10-CM | POA: Diagnosis present

## 2024-02-25 MED ORDER — AMOXICILLIN-POT CLAVULANATE 400-57 MG/5ML PO SUSR: 875.0000 mg | Freq: Two times a day (BID) | ORAL | 0 refills | Status: AC

## 2024-02-25 NOTE — ED Provider Notes (Signed)
 [facility] Emergency Department Provider Note     Event Date/Time   First MD Initiated Contact with Patient 02/25/24 1113     (approximate)   History   Dental Pain   HPI  Shelby Yang is a 48 y.o. female with a history of asthma, hypothyroidism, Graves' disease, and prediabetes, presents to the ED endorsing dental pain. She reports a broken crown to the bottom of her right molar in the area of concern.  Patient reports she was evaluated by a dental provider and told that she likely has a cavity developing under her previously placed amalgam filling.  She was able to establish an appoint with the dental provider locally in 2 weeks.  They were not able however, to provide her with antibiotic prescription since she is not quite established with the service yet.  She denies any fevers, chills, sweats, chest pain, or shortness of breath.  Physical Exam   Triage Vital Signs: ED Triage Vitals  Encounter Vitals Group     BP 02/25/24 1043 (!) 150/99     Girls Systolic BP Percentile --      Girls Diastolic BP Percentile --      Boys Systolic BP Percentile --      Boys Diastolic BP Percentile --      Pulse Rate 02/25/24 1042 65     Resp 02/25/24 1042 18     Temp 02/25/24 1042 98.3 F (36.8 C)     Temp Source 02/25/24 1042 Oral     SpO2 02/25/24 1042 100 %     Weight --      Height --      Head Circumference --      Peak Flow --      Pain Score 02/25/24 1043 7     Pain Loc --      Pain Education --      Exclude from Growth Chart --     Most recent vital signs: Vitals:   02/25/24 1042 02/25/24 1043  BP:  (!) 150/99  Pulse: 65   Resp: 18   Temp: 98.3 F (36.8 C)   SpO2: 100%     General Awake, no distress. NAD HEENT NCAT. PERRL. EOMI. No rhinorrhea. Mucous membranes are moist.  Uvula is midline tonsils are flat.  No oropharyngeal lesions appreciated.  Patient with an amalgam filling noted to the right lower first molar.  No surrounding gum  erythema or edema is noted. CV:  Good peripheral perfusion. RRR RESP:  Normal effort. CTA  ED Results / Procedures / Treatments   Labs (all labs ordered are listed, but only abnormal results are displayed) Labs Reviewed - No data to display   EKG   RADIOLOGY   No results found.   PROCEDURES:  Critical Care performed: No  Procedures   MEDICATIONS ORDERED IN ED: Medications - No data to display   IMPRESSION / MDM / ASSESSMENT AND PLAN / ED COURSE  I reviewed the triage vital signs and the nursing notes.                              Differential diagnosis includes, but is not limited to, dental infection, dental abscess, dental caries, gingivitis  Patient's presentation is most consistent with acute, uncomplicated illness.  Patient's diagnosis is consistent with dental pain secondary to dental caries, and underlying dental infection.  Patient with reassuring exam and workup at this  time.  No signs of any hoot airway compromise or space-occupying edema to the face or throat.  Patient will be discharged home with prescriptions for amoxicillin  suspension. Patient is to follow up with her dental provider as scheduled, as needed or otherwise directed. Patient is given ED precautions to return to the ED for any worsening or new symptoms.   FINAL CLINICAL IMPRESSION(S) / ED DIAGNOSES   Final diagnoses:  Pain due to dental caries  Dental infection     Rx / DC Orders   ED Discharge Orders          Ordered    amoxicillin -clavulanate (AUGMENTIN ) 400-57 MG/5ML suspension  2 times daily        02/25/24 1129             Note:  This document was prepared using Dragon voice recognition software and may include unintentional dictation errors.    Loyd Candida LULLA Aldona, PA-C 02/25/24 1617    Levander Slate, MD 02/25/24 1728

## 2024-02-25 NOTE — ED Triage Notes (Signed)
 Pt to ED via POV from home. Pt reports cracker the crown on her bottom right tooth. Dentist is in [facility] and has not been able to get in with him.

## 2024-02-25 NOTE — Discharge Instructions (Addendum)
 Take the prescription antibiotic as directed.  Follow-up with your dental provider in 2 weeks as scheduled.

## 2024-05-28 ENCOUNTER — Emergency Department: Admission: EM | Admit: 2024-05-28 | Discharge: 2024-05-28 | Disposition: A | Discharge status: Home / Self Care

## 2024-05-28 DIAGNOSIS — Z760 Encounter for issue of repeat prescription: Secondary | ICD-10-CM

## 2024-05-28 MED ORDER — LEVOTHYROXINE SODIUM 150 MCG PO TABS: 150.0000 ug | ORAL_TABLET | Freq: Every day | ORAL | 0 refills | Status: AC

## 2024-05-28 NOTE — ED Provider Notes (Signed)
 [facility] Provider Note    Event Date/Time   First MD Initiated Contact with Patient 05/28/24 1829     (approximate)   History   Medication Refill   HPI  Shelby Yang is a 48 y.o. female  with a past medical history of hypothyroidism, Graves disease, environmental allergies, asthma presents to the emergency department with need for medication refill of her levothyroxine .  She states she has been on the same dose (150 mcg) for many years.  Patient has the bottle with her.  Patient reports she is seen at [facility] facility in [place]  and her PCP is out of town for the next month.  She reports she has not taken her medication in 3 days. According to triage note, she does not have a thyroid. Patient has no medical complaints at this time.  Physical Exam   Triage Vital Signs: ED Triage Vitals  Encounter Vitals Group     BP 05/28/24 1758 133/75     Girls Systolic BP Percentile --      Girls Diastolic BP Percentile --      Boys Systolic BP Percentile --      Boys Diastolic BP Percentile --      Pulse Rate 05/28/24 1758 (!) 58     Resp 05/28/24 1758 18     Temp 05/28/24 1758 97.8 F (36.6 C)     Temp Source 05/28/24 1758 Oral     SpO2 05/28/24 1758 100 %     Weight 05/28/24 1759 210 lb (95.3 kg)     Height 05/28/24 1759 5' 4 (1.626 m)     Head Circumference --      Peak Flow --      Pain Score 05/28/24 1758 0     Pain Loc --      Pain Education --      Exclude from Growth Chart --     Most recent vital signs: Vitals:   05/28/24 1758  BP: 133/75  Pulse: (!) 58  Resp: 18  Temp: 97.8 F (36.6 C)  SpO2: 100%    General: Awake, in no acute distress. Appears stated age. Head: Normocephalic, atraumatic. Neck: Supple. CV: Good peripheral perfusion.  Respiratory:Normal respiratory effort.  No respiratory distress.  GI: Soft, non-distended. Skin:Warm, dry, intact.  Neurological: A&Ox4 to person, place, time, and  situation.  Psychiatric: Mood and affect appropriate. Thought processes coherent.   ED Results / Procedures / Treatments   Labs (all labs ordered are listed, but only abnormal results are displayed) Labs Reviewed - No data to display   EKG     RADIOLOGY    PROCEDURES:  Critical Care performed: No   Procedures   MEDICATIONS ORDERED IN ED: Medications - No data to display   IMPRESSION / MDM / ASSESSMENT AND PLAN / ED COURSE  I reviewed the triage vital signs and the nursing notes.                              Differential diagnosis includes, but is not limited to, need for medication refill, hypothyroidism, well adult check  Patient's presentation is most consistent with acute, uncomplicated illness.  Patient is a 48 year old female here for medication refill.  She takes 150 mcg of levothyroxine  daily and has not had her medication in 3 days.  She has no complaints at this time.  She is well-appearing on exam.  Will  refill for 1 month until she can see her PCP for further refills.  The patient may return to the emergency department for any new, worsening, or concerning symptoms. Patient was given the opportunity to ask questions; all questions were answered. Emergency department return precautions were discussed with the patient.  Patient is in agreement to the treatment plan.  Patient is stable for discharge.    FINAL CLINICAL IMPRESSION(S) / ED DIAGNOSES   Final diagnoses:  Medication refill     Rx / DC Orders   ED Discharge Orders          Ordered    levothyroxine  (SYNTHROID ) 150 MCG tablet  Daily        05/28/24 1900             Note:  This document was prepared using Dragon voice recognition software and may include unintentional dictation errors.     Sheron Salm, PA-C 05/28/24 2037    Nicholaus Rolland BRAVO, MD 05/28/24 [phone]

## 2024-05-28 NOTE — Discharge Instructions (Addendum)
 You were seen in the emergency department for medication refill today.  Please follow-up with your primary care provider in [place] for further refills and for any labs if needed. Thanks for letting me be a part of your care!

## 2024-05-28 NOTE — ED Triage Notes (Signed)
 Pt presents to the ED via POV from home for refill of her synthroid . Pt states that she has been on the same dosing for 6 months. Pt has a bottle in hand with her. States that her PCP is out of town for an extended vacation. Pt reports that she has not taken her meds in 3 days. PT states that she does not have a thyroid
